# Patient Record
Sex: Female | Born: 1975 | Race: Black or African American | Hispanic: No | Marital: Married | Smoking: Never smoker
Health system: Southern US, Community
[De-identification: ages and names within clinical notes are randomized; demographics above are authoritative.]

## PROBLEM LIST (undated history)

## (undated) DIAGNOSIS — K219 Gastro-esophageal reflux disease without esophagitis: Secondary | ICD-10-CM

## (undated) DIAGNOSIS — O165 Unspecified maternal hypertension, complicating the puerperium: Secondary | ICD-10-CM

---

## 2003-09-20 ENCOUNTER — Emergency Department (HOSPITAL_COMMUNITY): Admission: EM | Admit: 2003-09-20 | Discharge: 2003-09-20 | Payer: Self-pay | Admitting: Family Medicine

## 2004-06-04 ENCOUNTER — Emergency Department (HOSPITAL_COMMUNITY): Admission: EM | Admit: 2004-06-04 | Discharge: 2004-06-04 | Payer: Self-pay | Admitting: Family Medicine

## 2004-07-19 ENCOUNTER — Emergency Department (HOSPITAL_COMMUNITY): Admission: EM | Admit: 2004-07-19 | Discharge: 2004-07-19 | Payer: Self-pay | Admitting: Family Medicine

## 2004-10-16 ENCOUNTER — Ambulatory Visit (HOSPITAL_COMMUNITY): Admission: RE | Admit: 2004-10-16 | Discharge: 2004-10-16 | Payer: Self-pay | Admitting: Family Medicine

## 2004-10-24 ENCOUNTER — Ambulatory Visit: Payer: Self-pay | Admitting: Internal Medicine

## 2004-10-29 ENCOUNTER — Ambulatory Visit: Payer: Self-pay | Admitting: Internal Medicine

## 2004-12-04 ENCOUNTER — Ambulatory Visit: Payer: Self-pay | Admitting: Internal Medicine

## 2005-12-24 ENCOUNTER — Ambulatory Visit: Payer: Self-pay | Admitting: Internal Medicine

## 2006-07-14 ENCOUNTER — Inpatient Hospital Stay (HOSPITAL_COMMUNITY): Admission: AD | Admit: 2006-07-14 | Discharge: 2006-07-14 | Payer: Self-pay | Admitting: Obstetrics & Gynecology

## 2006-07-16 ENCOUNTER — Inpatient Hospital Stay (HOSPITAL_COMMUNITY): Admission: AD | Admit: 2006-07-16 | Discharge: 2006-07-16 | Payer: Self-pay | Admitting: Obstetrics & Gynecology

## 2006-07-18 ENCOUNTER — Inpatient Hospital Stay (HOSPITAL_COMMUNITY): Admission: AD | Admit: 2006-07-18 | Discharge: 2006-07-23 | Payer: Self-pay | Admitting: Obstetrics & Gynecology

## 2006-07-20 ENCOUNTER — Encounter (INDEPENDENT_AMBULATORY_CARE_PROVIDER_SITE_OTHER): Payer: Self-pay | Admitting: Specialist

## 2006-07-20 ENCOUNTER — Ambulatory Visit (HOSPITAL_COMMUNITY): Admission: RE | Admit: 2006-07-20 | Discharge: 2006-07-20 | Payer: Self-pay | Admitting: Obstetrics & Gynecology

## 2006-07-24 ENCOUNTER — Encounter: Admission: RE | Admit: 2006-07-24 | Discharge: 2006-07-29 | Payer: Self-pay | Admitting: Obstetrics and Gynecology

## 2006-09-17 ENCOUNTER — Ambulatory Visit: Payer: Self-pay | Admitting: Internal Medicine

## 2006-09-17 LAB — CONVERTED CEMR LAB
Amylase: 88 units/L (ref 27–131)
Basophils Absolute: 0 10*3/uL (ref 0.0–0.1)
CO2: 31 meq/L (ref 19–32)
Chloride: 105 meq/L (ref 96–112)
Creatinine, Ser: 0.7 mg/dL (ref 0.4–1.2)
Eosinophils Absolute: 0.1 10*3/uL (ref 0.0–0.6)
Hemoglobin: 13.2 g/dL (ref 12.0–15.0)
Ketones, ur: NEGATIVE mg/dL
Leukocytes, UA: NEGATIVE
Lipase: 21 units/L (ref 11.0–59.0)
Lymphocytes Relative: 35.2 % (ref 12.0–46.0)
MCHC: 33.1 g/dL (ref 30.0–36.0)
Monocytes Relative: 4.8 % (ref 3.0–11.0)
Potassium: 3.9 meq/L (ref 3.5–5.1)
Sodium: 140 meq/L (ref 135–145)

## 2006-09-23 ENCOUNTER — Ambulatory Visit (HOSPITAL_COMMUNITY): Admission: RE | Admit: 2006-09-23 | Discharge: 2006-09-23 | Payer: Self-pay | Admitting: Internal Medicine

## 2006-09-23 ENCOUNTER — Ambulatory Visit: Payer: Self-pay | Admitting: Internal Medicine

## 2006-10-27 ENCOUNTER — Ambulatory Visit: Payer: Self-pay | Admitting: Internal Medicine

## 2007-07-13 DIAGNOSIS — K449 Diaphragmatic hernia without obstruction or gangrene: Secondary | ICD-10-CM | POA: Insufficient documentation

## 2007-07-13 DIAGNOSIS — G43909 Migraine, unspecified, not intractable, without status migrainosus: Secondary | ICD-10-CM | POA: Insufficient documentation

## 2007-07-13 DIAGNOSIS — K219 Gastro-esophageal reflux disease without esophagitis: Secondary | ICD-10-CM

## 2007-09-02 ENCOUNTER — Telehealth: Payer: Self-pay | Admitting: Internal Medicine

## 2008-11-13 ENCOUNTER — Inpatient Hospital Stay (HOSPITAL_COMMUNITY): Admission: AD | Admit: 2008-11-13 | Discharge: 2008-11-13 | Payer: Self-pay | Admitting: Obstetrics and Gynecology

## 2008-11-14 ENCOUNTER — Ambulatory Visit (HOSPITAL_COMMUNITY): Admission: AD | Admit: 2008-11-14 | Discharge: 2008-11-14 | Payer: Self-pay | Admitting: Obstetrics and Gynecology

## 2009-02-01 ENCOUNTER — Inpatient Hospital Stay (HOSPITAL_COMMUNITY): Admission: RE | Admit: 2009-02-01 | Discharge: 2009-02-03 | Payer: Self-pay | Admitting: Obstetrics and Gynecology

## 2010-07-03 LAB — CBC
HCT: 32.5 % — ABNORMAL LOW (ref 36.0–46.0)
HCT: 38.7 % (ref 36.0–46.0)
Hemoglobin: 11 g/dL — ABNORMAL LOW (ref 12.0–15.0)
MCV: 92.9 fL (ref 78.0–100.0)
WBC: 9.3 10*3/uL (ref 4.0–10.5)

## 2010-08-13 LAB — ABO/RH: RH Type: POSITIVE

## 2010-08-13 LAB — HIV ANTIBODY (ROUTINE TESTING W REFLEX): HIV: NONREACTIVE

## 2010-08-13 LAB — ANTIBODY SCREEN: Antibody Screen: NEGATIVE

## 2010-08-13 LAB — HEPATITIS B SURFACE ANTIGEN: Hepatitis B Surface Ag: NEGATIVE

## 2010-08-13 NOTE — Assessment & Plan Note (Signed)
[facility]                         GASTROENTEROLOGY OFFICE NOTE   NAME:[name], [name] PEIL               MRN:          [id number]  DATE:09/17/2006                            DOB:          05-03-1975    REFERRING PHYSICIAN:  Maxie Better, M.D.   OFFICE CONSULTATION NOTE   REASON FOR CONSULTATION:  Persistent worsening abdominal pain.   HISTORY:  This is a pleasant 35 year old female with a history of  gastroesophageal reflux disease.  She is referred today regarding  persistent abdominal pain.  She has been seen in this office for  recurrent problems with epigastric pain.  Previous evaluations have  included upper endoscopy, which revealed changes consistent with reflux  and abdominal ultrasound, which was negative except for gallbladder  polyp.  She was last seen in this office in September of 2007.  Her  problems with abdominal discomfort were resolved on Prevacid.  She  continues on Prevacid.  She did well until this past April.  She was  about [redacted] weeks pregnant and underwent urgent Cesarean section July 20, 2006 for fetal distress.  At that time, some abnormally cloudy fluid  with an abnormal odor was apparrently noted.  She had an elevated white  blood cell count of 23,000.  A CT scan of the abdomen and pelvis was  ordered and performed on July 20, 2006.  This was quite abnormal with  evidence of right-sided pneumonia, small to moderate fluid collection in  the right lower quadrant and pelvis, as well as mild to moderate lateral  hydronephrosis.  She was seen by Dr. Abbey Chatters in consultation.  She  was treated with antibiotics and apparently improved.  It is not clear  that she had followup CT scan.  In any event, she says that she went  home and was well all of May, as well as June, until this past Saturday  when she developed epigastric discomfort.  There has been some nausea,  though no vomiting or fevers.  No change in bowel  habits.  The  discomfort is not exacerbated by meals.  However, the discomfort is  currently exacerbated by movement or jarring motions.  She has continued  on Prevacid without improvement.  Gas-X and Tums have been unhelpful.  She saw Dr. Cherly Hensen yesterday, who recommended GI evaluation.  The  patient contacted the office this morning and was worked into the  schedule on an urgent basis.   PHYSICAL EXAM:  Finds a well-appearing female in no acute distress.  Blood pressure 126/82, heart rate 90, weight is 125.6 pounds.  HEENT:  Sclerae anicteric, conjunctivae pink.  Oral mucosa intact.  No  adenopathy.  LUNGS:  Clear to auscultation and percussion.  HEART:  Regular.  ABDOMEN:  Soft with slight fullness in the epigastric region.  She is  tender diffusely, though mostly in the epigastric region.  No rebound or  guarding.  EXTREMITIES:  Without edema.   IMPRESSION:  The patient presents today with persistent epigastric  discomfort.  This is 7 weeks after Cesarean section and an abnormal CT  scan as described above.  My current concern is that this may  represent  the same ill defined process as previous.The possibility of smoldering  retrocecal appendicitis remains.  She is not acutely ill, though I did  offer her hospitalization to help sort this out.  She declined.  As  such, I would like to order stat laboratories including CBC,  comprehensive metabolic panel, amylase, lipase, and urinalysis.  Additionally, we will set her up for a stat contrast-enhanced CT scan of  the abdomen and pelvis.  Those results will be called to myself or the  on-call physician, Dr. Leone Payor, with whom I have discussed the case.  The patient will wait for further directions.  If no obvious problem or  significant abnormality found, then we will increase her proton pump  inhibitor twice daily and ask her to follow up with myself in the office  next week.   ADDENDUM:  Labs were unremarkable. CT of abdomen was  negative. CT of the  pelvis showed three discrete  organized fluid collections measuring  between 2cm and 4cm. I discussed this with Dr. Cherly Hensen, who will manage  the pelvic fluid collections as she sees fit. I will see the patient  back next week for her seemingly unrelated epigastric pain.     Wilhemina Bonito. Marina Goodell, MD  Electronically Signed    JNP/MedQ  DD: 09/17/2006  DT: 09/17/2006  Job #: [id number]   cc:   Maxie Better, M.D.  Adolph Pollack, M.D.

## 2010-08-13 NOTE — Discharge Summary (Signed)
NAME:  [name] [name], [name] [name] NO.:  [id number]   MEDICAL RECORD NO.:  [id number]          PATIENT TYPE:  INP   LOCATION:  9312                          FACILITY:  [facility]   PHYSICIAN:  Genia Del, M.D.DATE OF BIRTH:  1976-02-02   DATE OF ADMISSION:  07/17/2006  DATE OF DISCHARGE:  07/23/2006                               DISCHARGE SUMMARY   ADMISSION DIAGNOSIS:  [redacted] weeks gestation in preterm labor, vaso previa  and fetal heart rate monitoring with decelerations.   DISCHARGE DIAGNOSIS:  [redacted] weeks gestation in preterm labor, vaso previa  and fetal heart rate monitoring with decelerations plus submucosal myoma  and peritonitis.   PROCEDURE:  Urgent primary low transverse C-section, resection of myoma,  peritoneal fluid appeared infected, no evidence of appendicitis, good  irrigation and suction of the abdominopelvic cavity. No abscess  identified. The estimated blood loss was 700 mL. The patient received  Ancef 1 gram IV at cord clamping. The patient was put on Zosyn IV postop  to control any infectious process.  She was found to have a right-sided  pneumonia and an inflammatory process intra-abdominally there was no  evidence of peritonitis clinically after the surgery. The patient had  good evaluation of her pneumonia and the infectious process cleared  under the Zosyn IV.  She was discharged home on postop day #3 in good  stable status. She was continued on p.o. antibiotics with azithromycin  x6 more days.  She was given postoperative advice and will follow-up at  [facility] office. Her postoperative hemoglobin was 10 and no other  complication.      Genia Del, M.D.  Electronically Signed     ML/MEDQ  D:  09/22/2006  T:  09/22/2006  Job:  [id number]

## 2010-08-13 NOTE — Assessment & Plan Note (Signed)
[facility]                         GASTROENTEROLOGY OFFICE NOTE   NAME:[name] [name], [name] [name]               MRN:          [id number]  DATE:09/23/2006                            DOB:          1976-03-03    HISTORY:  [name] [name] presents today for followup.  She was evaluated  September 17, 2006 for worsening abdominal pain.  See that dictation for  details.  Laboratories, including CBC, comprehensive metabolic panel,  amylase, lipase, and urinalysis were unremarkable.  She went for an  urgent CT scan of the abdomen and pelvis.  The abdomen was unremarkable.  The pelvis revealed 3 discrete, well-defined fluid collections measuring  between 2-1/2 and 4-1/2 cm.  She is status post urgent caesarean section  in late April.  The patient's Prevacid was increased to 30 mg b.i.d. and  she was given Darvocet as needed for pain.  The findings in the pelvis  were discussed with Dr. Cherly Hensen.  I asked the patient to return today  for followup.  Since her last visit she continues with abdominal  discomfort.  She continues with some epigastric discomfort.  However,  she also now has lower abdominal and pelvic discomfort.  She finds it  difficult to stand for long periods of time.  She denies nausea,  vomiting, or fevers.  Her appetite is good.  She is worried.  I  scheduled a HIDA scan which was performed this morning.  This was  normal.   PHYSICAL EXAMINATION:  A well-appearing female in no acute distress.  Blood pressure is 118/80, heart rate is 100 and regular, weight is 124.4  pounds.  HEENT:  Sclerae anicteric.  LUNGS:  Clear.  HEART:  Regular.  ABDOMEN:  Soft with complaints of tenderness to palpation throughout  though mostly in the right lower quadrant.  No rebound or guarding.   IMPRESSION:  Abdominal discomfort.  No obvious acute abdominal process.  Laboratories, CT scan of the abdomen, and most recent endoscopy  unremarkable.  She is on b.i.d. Prevacid.  I  am wondering how much of  her discomfort might be related to the fluid collections in the pelvis  as well as expected changes post caesarean section.  From my standpoint,  I would like to continue her on b.i.d. Prevacid and Darvocet as needed  for pain.  With regards to the pelvic fluid collections, she will follow  up with Dr. Cherly Hensen.  The patient tells me that she has an ultrasound  scheduled with Dr. Cherly Hensen for later this week.  I tried to provide  reassurance with regards to the abdomen proper.  I would like to see her  back for followup in 1 month.     Wilhemina Bonito. Marina Goodell, MD  Electronically Signed    JNP/MedQ  DD: 09/23/2006  DT: 09/23/2006  Job #: [id number]   cc:   Maxie Better, M.D.  Adolph Pollack, M.D.

## 2010-08-13 NOTE — Assessment & Plan Note (Signed)
[facility]                         GASTROENTEROLOGY OFFICE NOTE   NAME:[name], [name] HORN               MRN:          [id number]  DATE:10/27/2006                            DOB:          November 15, 1975    HISTORY:  [name] [name] presents today for followup.  She was evaluated  September 23, 2006 for abdominal discomfort, as well as abnormal pelvic fluid  collections on recent CT scan.  In terms of her upper abdominal  discomfort, no cause elucidated.  Her symptoms seem to be exacerbated by  dairy products.  Lactaid helps occasionally.  She was given Darvocet,  which she required approximately 2 pills per week.  This helps.  In  terms of her lower abdominal discomfort and fullness, this has improved.  I received a call from Dr. Cherly Hensen, who performed a pelvic ultrasound  about a week after the patient's office visit.  She told me that the  pelvic ultrasound was now negative.  The patient states she is overall  feeling much better without any new or active problems.   CURRENT MEDICATIONS:  Darvocet as needed.  Prevacid.  Lactaid.   PHYSICAL EXAM:  Finds a well-appearing female in no acute distress.  Blood pressure 118/76, heart rate is 88, weight is 124.2 pounds (no  change).  ABDOMEN:  Soft without tenderness, mass, or hernia.   IMPRESSION:  1. Chronic intermittent epigastric discomfort of uncertain cause.      Question functional dyspepsia.  2. Problems with lower abdominal fullness, discomfort, and pelvic      fluid collections.  Seemingly resolved.   RECOMMENDATIONS:  1. Continue occasional Darvocet as needed for discomfort.      Prescription of 60 with 1 refill has been provided.  2. Routine GI followup in about 6 months.  Sooner if needed.     Wilhemina Bonito. Marina Goodell, MD  Electronically Signed    JNP/MedQ  DD: 10/27/2006  DT: 10/28/2006  Job #: [id number]   cc:   Maxie Better, M.D.  Adolph Pollack, M.D.

## 2010-08-13 NOTE — Assessment & Plan Note (Signed)
[facility]                         GASTROENTEROLOGY OFFICE NOTE   NAME:Makris, SOMALY MARTENEY               MRN:          [id number]  DATE:09/17/2006                            DOB:          1976-02-11    Ms. [name] [name] is a patient of Dr. Lamar Sprinkles seen today for epigastric  pain.  She had had a complicated post delivery course with fluid  collections in the abdomen and other problems, and a CT scan was ordered  today because of her pain.  She has 3 small, well-defined fluid  collections in the pelvis that could be an abscess but she has no fever  and pain there.  These fluid collections are 2-4 cm.  Her CBC, her BMET,  her amylase and lipase are all normal.  She has a small amount of blood  in her urine.  I told her that she could go home as she has no fever or  toxic signs or symptoms that we know of at this point and take her  Prevacid twice a day as Dr. Marina Goodell has suggested, increasing to b.i.d.  from daily.  I will communicate the results with Dr. Marina Goodell and we will  coordinate any other further followup.  He plans to see her back in a  week or so at least, but further plans pending that.     Iva Boop, MD,FACG  Electronically Signed    CEG/MedQ  DD: 09/17/2006  DT: 09/17/2006  Job #: [id number]   cc:   Wilhemina Bonito. Marina Goodell, MD  Maxie Better, M.D.

## 2010-08-16 NOTE — Assessment & Plan Note (Signed)
 [facility]ALTH[facility]                           GASTROENTEROLOGY OFFICE NOTE   [name] [name], [name] [name]                     MRN:          [id number]  DATE:12/24/2005                            DOB:          04-21-1975    HISTORY:  [name] [name] presents today for followup. She is a 35 year old with  intermittent epigastric discomfort, felt secondary to reflux disease. For  this she is on daily proton pump inhibitor therapy. Currently, Prevacid 30  mg daily. On the medication, she reports excellent control of symptoms with  rare minor discomfort. She has been apprehensive about going off the  medicine for fear of recurrent symptoms. She denies any appreciable  medication side effects. She does request medication refill. She has had no  interval medical problems or other relevant issues.   CURRENT MEDICATIONS:  Prevacid 30 mg daily.   PHYSICAL EXAMINATION:  Finds a well-appearing female in no acute distress.  She is alert and oriented.  Blood pressure 118/68, heart rate 62 and  regular, weight 129.  HEENT: Sclera anicteric.  LUNGS: Clear.  HEART: Regular.  ABDOMEN: Soft, without tenderness, mass or hernia. Good bowel sounds heard.   IMPRESSION:  Intermittent epigastric discomfort likely related to reflux  disease. Symptoms absent on once daily Prevacid.   RECOMMENDATIONS:  1. Continue Prevacid 30 mg daily. A prescription of multiple refills has      been provided.  2. Patient should feel free to hold her medication for an extended period      of time to see if symptoms return. If so, then resume medication, if      not, then she might use the medication on-demand.  3. Ongoing medical care with Dr. Katrinka Blazing.            ______________________________  Wilhemina Bonito. Eda Keys., MD      JNP/MedQ  DD:  12/24/2005  DT:  12/26/2005  Job #:  [id number]   cc:   Dario Guardian, M.D.

## 2010-08-16 NOTE — H&P (Signed)
NAME:  [name] [name], [name] [name] NO.:  [id number]   MEDICAL RECORD NO.:  [id number]          PATIENT TYPE:  OIB   LOCATION:  9160                          FACILITY:  [facility]   PHYSICIAN:  Genia Del, M.D.DATE OF BIRTH:  1975-12-26   DATE OF ADMISSION:  07/17/2006  DATE OF DISCHARGE:                              HISTORY & PHYSICAL   CHIEF COMPLAINT:  Contractions.  She is a 35 year old African-American  female, G1, P0 at 37-4/[redacted] weeks gestation who presents with increased  frequency of contractions.  Seen in [facility] Admissions last night with  subcu terbutaline x1 given, normal ultrasound with a cervical length of  3.2 cm.  FFN was negative on April 15, urine culture negative on April  15, wet prep negative today, GBS pending.  She has a pregnancy that is  complicated by a complete placenta previa as noted on repeat ultrasound  of July 16, 2006.  AGA fetus is noted.  SHE HAS A CODEINE ALLERGY.   MEDICATIONS:  Prenatal vitamins.   FAMILY HISTORY:  1. Chronic hypertension.  2. Myocardial infarction.   She has a history of menstrual migraines and gastroesophageal reflux  disease.  She is a nonsmoker, nondrinker.  She denies domestic or  physical violence.  Pregnancy course otherwise uncomplicated as noted.   PHYSICAL EXAM:  She is an uncomfortable, otherwise normal appearing  African-American female in no acute distress.  Blood pressure 118/84.  HEENT:  Normal.  LUNGS:  Clear.  HEART:  Regular rate and rhythm.  ABDOMEN:  Soft, gravid, nontender, no CVA tenderness.  SKIN:  Intact.  NEUROLOGIC EXAM:  Nonfocal.  CERVICAL SPECULUM EXAM:  Reveals the cervix to visually be long and  closed, no bleeding is noted.  EXTREMITIES:  Show no cords.  NEUROLOGIC EXAM:  Nonfocal.   NST is reactive.  Contractions are rare to nonexistent after subcu  terbutaline and Procardia given.   IMPRESSION:  1. A 31-4/7 week intrauterine pregnancy.  2. Complete placenta previa.  3.  Preterm contractions with no evidence of cervical change.  Fetal      fibronectin negative and no bleeding.   PLAN:  1. Will admit for 22 hour observation.  2. Given placenta previa and continued contractions, start Procardia      10 mEq p.o. q.4h., given betamethasone 12.5 mg IM and repeat in 24      hours.  3. Continue IV fluids.      Lenoard Aden, M.D.  Electronically Signed     ______________________________  Genia Del, M.D.    RJT/MEDQ  D:  07/17/2006  T:  07/17/2006  Job:  [id number]

## 2010-08-16 NOTE — Op Note (Signed)
NAME:  [name] [name], [name] [name] NO.:  [id number]   MEDICAL RECORD NO.:  [id number]          PATIENT TYPE:  INP   LOCATION:                                [facility]   PHYSICIAN:  Genia Del, M.D.DATE OF BIRTH:  [birth year]   DATE OF PROCEDURE:  07/20/2006  DATE OF DISCHARGE:                               OPERATIVE REPORT   PREOPERATIVE DIAGNOSIS:  32 weeks with preterm labor, vaso previa and  fetal heart rate decelerations.   POSTOP DIAGNOSIS:  32 weeks with preterm labor, vaso previa and fetal  heart rate decelerations.  Plus peritonitis and submucosal myoma.   PROCEDURE:  Urgent primary low transverse C-section and resection of  myoma.   SURGEON:  Dr. Genia Del   ASSISTANT:  Dr. Marina Gravel.   ANESTHESIOLOGIST:  Dr. Malen Gauze.   PROCEDURE:  Under spinal anesthesia with the patient in 15 degrees left  decubitus position.  She is prepped with Betadine on the abdominal,  suprapubic, vulvar, and vaginal areas.  The bladder catheter is inserted  with a Foley and the patient is draped as usual.  The level of  anesthesia is verified and adequate.  We infiltrate the subcutaneous  tissue with Marcaine one-quarter plain 10 mL.  We make a Pfannenstiel  incision with a scalpel.  We opened the aponeurosis transversely with  Mayo scissors.  We separated the aponeurosis from the recti muscles on  the midline superiorly and inferiorly.  We opened the parietal  peritoneum with a Metz scissors longitudinally.  The peritoneal fluid is  yellowish with a foul odor. We then opened the visceral peritoneum  transversely over the lower uterine segment with the [facility] scissors and  reclined the bladder downward.  The bladder retractor is put in place.  We make a low transverse uterine incision with the scalpel and extended  with the dressing scissors.  The amniotic fluid is clear.  The fetus is  in cephalic presentation.  Birth of a baby boy at 12:56. The baby is  suctioned, the cord is clamped and cut. The baby is given to the  neonatal team.  Apgars are 7 and 9. A pH is done which comes back at  7.37. The cord blood is taken.  The placenta was very low posterior with  the velamentous cord insertion and a vaso previa. We evacuate the  placenta and sent to pathology. Uterine revision done. We note a small 1  cm submucosal myoma on the superior posterior wall.  It is removed with  the fingers and sent to pathology.  The uterus contracts well with  Pitocin IV. The hysterotomy is closed with a first locked running suture  of chromic 0. A second layer in a mattress fashion is done.  Hemostasis  is adequate.  Both ovaries are normal in size and appearance.  Both  tubes are normal in appearance. We then irrigate and suction the  abdominopelvic cavity.  We attempt to visualize the appendix but it is  not successful.  We then complete hemostasis on the recti muscles where  necessary with the electrocautery.  We closed the aponeurosis in two  half  running sutures of Vicryl 0.  We then verify hemostasis on the  adipose tissue and complete it with the electrocautery.  We  reapproximate the skin with staples.  A dry dressing is applied.  The  count of instruments and gauze was complete x2. The estimated blood loss  was 700 mL.  No complications occurred and  the patient was brought to recovery room in good status. Note that a  dose of Ancef was given at the beginning of the intervention.  The  patient had been on Unasyn IV.  We will treat her with Zosyn in the  postop time and she will go to ICU given the diagnosis of peritonitis.      Genia Del, M.D.  Electronically Signed     ML/MEDQ  D:  07/20/2006  T:  07/20/2006  Job:  [id number]

## 2010-08-16 NOTE — Consult Note (Signed)
NAME:  [name] [name], [name] [name] NO.:  [id number]   MEDICAL RECORD NO.:  [id number]          PATIENT TYPE:  INP   LOCATION:                                [facility]   PHYSICIAN:  Adolph Pollack, M.D.DATE OF BIRTH:  Oct 03, 1975   DATE OF CONSULTATION:  07/21/2006  DATE OF DISCHARGE:                                 CONSULTATION   REASON FOR CONSULTATION:  Peritonitis.   HISTORY OF PRESENT ILLNESS:  This is a 35 year old female who for the  past week has been having problems with contractions.  She was about 31-  [redacted] weeks pregnant. She was admitted on July 13, 2006 and treated  medically to try to get the contractions to stop.  Apparently, there was  some fetal distress and she underwent an urgent cesarean section on  July 20, 2006.  During the procedure, some cloudy fluid with an odd  odor was noted. The appendix was not seen.  She had irrigation of the  abdominopelvic cavity performed and the wound was closed. She was  brought to the intensive care unit.  A CT scan was ordered looking for  appendicitis. The appendix was not visualized.  She does have  significant  infiltrates in the right middle and lower lobes in the  right lung. She has a small to moderate amount of fluid collection in  the pelvic and right lower quadrant area with possible early thin  rimming. White count was 23,000 on the 20th and on the 21st it was down  to 16,000.  Subsequently, I was asked to see her to evaluate for  possible peritonitis.  Currently she says she is pain free and feels  good.  Pre-delivery she did not have any fever or chills.   PAST MEDICAL HISTORY:  1. Migraine headaches.  2. Gastroesophageal reflux disease.   PREADMISSION MEDICATIONS:  Prenatal vitamins and iron. She has been  started on Zosyn, Protonix, and some p.r.n. medications.   SOCIAL HISTORY:  Nonsmoker, nondrinker.   REVIEW OF SYSTEMS:  CARDIAC:  No cardiac disease.  PULMONARY: She states  she has  developed a cough since being in the hospital.   PHYSICAL EXAMINATION:  GENERAL:  Well-developed, well-nourished female.  She is in no acute distress, pleasant, cooperative, sitting up in the  bed smiling.  VITAL SIGNS:  Temperature 99.8, blood pressure 133/83, pulse 107.  LUNGS:  Rhonchi-type changes in the right lung. Left lung is clear.  ABDOMEN:  Slightly firm with a lower transverse dressing. There is  minimal tenderness present.  No peritoneal signs noted.  Hypoactive  bowel sounds noted.   CT was reviewed with Dr. Lenoria Farrier A. Hoss.   IMPRESSION:  1. Right-sided pneumonia.  2. Abdominal fluid collection of unclear significance immediately      postoperatively.  Also cannot definitely explain the intraoperative      findings of some cloudy fluid with an odd odor.  There is a      possibility that she could have had a predelivery intra-abdominal      inflammatory infectious process leading to the premature      contractions. She looks  fairly good right now and had      abdominopelvic irrigation and IV antibiotics.  No obvious      appendicitis on her CT scan.   RECOMMENDATIONS:  I will continue her IV antibiotics and switch to oral  for the pneumonia. Would follow her white blood cell count and the  clinical examination. I would progress her along with a routine post C  section postoperative course as she would tolerate. Currently, I do not  feel any further procedures or operative intervention is needed.  If she  would begin to have a persistent leukocytosis or get more ill, I would  recommend repeating her CT scan. I will be available if needed further.      Adolph Pollack, M.D.  Electronically Signed     TJR/MEDQ  D:  07/21/2006  T:  07/21/2006  Job:  [id number]   cc:   Maxie Better, M.D.  Fax: [phone]

## 2011-01-23 ENCOUNTER — Other Ambulatory Visit: Payer: Self-pay | Admitting: Obstetrics and Gynecology

## 2011-01-30 ENCOUNTER — Encounter (HOSPITAL_COMMUNITY)
Admission: RE | Admit: 2011-01-30 | Discharge: 2011-01-30 | Disposition: A | Discharge status: Home / Self Care | Payer: Medicaid Other | Source: Ambulatory Visit | Attending: Obstetrics and Gynecology | Admitting: Obstetrics and Gynecology

## 2011-01-30 ENCOUNTER — Encounter (HOSPITAL_COMMUNITY): Payer: Self-pay

## 2011-01-30 HISTORY — DX: Gastro-esophageal reflux disease without esophagitis: K21.9

## 2011-01-30 LAB — SURGICAL PCR SCREEN: Staphylococcus aureus: POSITIVE — AB

## 2011-01-30 LAB — CBC
HCT: 38 % (ref 36.0–46.0)
Hemoglobin: 12 g/dL (ref 12.0–15.0)
MCHC: 31.6 g/dL (ref 30.0–36.0)
RBC: 4.3 MIL/uL (ref 3.87–5.11)

## 2011-01-30 NOTE — Patient Instructions (Addendum)
   Your procedure is scheduled WU:[id number] November 5th  Enter through the Main Entrance of [facility] at:6am Pick up the phone at the desk and dial [phone] and inform us of your arrival.  Please call this number if you have any problems the morning of surgery: [phone]  Remember: Do not eat food after midnight:Sunday Do not drink clear liquids after:midnight Sunday Take these medicines the morning of surgery with a SIP OF WATER:none  Do not wear jewelry, make-up, or FINGER nail polish Do not wear lotions, powders, or perfumes.  You may not  wear deodorant. Do not shave 48 hours prior to surgery. Do not bring valuables to the hospital.  Leave suitcase in the car. After Surgery it may be brought to your room. For patients being admitted to the hospital, checkout time is 11:00am the day of discharge.     Remember to use your hibiclens as instructed.Please shower with 1/2 bottle the evening before your surgery and the other 1/2 bottle the morning of surgery.

## 2011-02-02 MED ORDER — CEFAZOLIN SODIUM-DEXTROSE 2-3 GM-% IV SOLR
2.0000 g | INTRAVENOUS | Status: AC
  Administered 2011-02-03: 2 g via INTRAVENOUS
  Filled 2011-02-02: qty 50

## 2011-02-03 ENCOUNTER — Encounter (HOSPITAL_COMMUNITY): Payer: Self-pay | Admitting: *Deleted

## 2011-02-03 ENCOUNTER — Other Ambulatory Visit: Payer: Self-pay | Admitting: Obstetrics and Gynecology

## 2011-02-03 ENCOUNTER — Encounter (HOSPITAL_COMMUNITY)
Admission: RE | Disposition: A | Discharge status: Home / Self Care | Payer: Self-pay | Source: Ambulatory Visit | Attending: Obstetrics and Gynecology

## 2011-02-03 ENCOUNTER — Inpatient Hospital Stay (HOSPITAL_COMMUNITY): Payer: Medicaid Other | Admitting: Anesthesiology

## 2011-02-03 ENCOUNTER — Inpatient Hospital Stay (HOSPITAL_COMMUNITY)
Admission: RE | Admit: 2011-02-03 | Discharge: 2011-02-05 | DRG: 766 | Disposition: A | Discharge status: Home / Self Care | Payer: Medicaid Other | Source: Ambulatory Visit | Attending: Obstetrics and Gynecology | Admitting: Obstetrics and Gynecology

## 2011-02-03 ENCOUNTER — Encounter (HOSPITAL_COMMUNITY): Payer: Self-pay | Admitting: Anesthesiology

## 2011-02-03 DIAGNOSIS — Z302 Encounter for sterilization: Secondary | ICD-10-CM

## 2011-02-03 DIAGNOSIS — Z01818 Encounter for other preprocedural examination: Secondary | ICD-10-CM

## 2011-02-03 DIAGNOSIS — O34219 Maternal care for unspecified type scar from previous cesarean delivery: Principal | ICD-10-CM | POA: Diagnosis present

## 2011-02-03 DIAGNOSIS — Z01812 Encounter for preprocedural laboratory examination: Secondary | ICD-10-CM

## 2011-02-03 LAB — TYPE AND SCREEN: Antibody Screen: NEGATIVE

## 2011-02-03 SURGERY — Surgical Case
Anesthesia: Spinal

## 2011-02-03 MED ORDER — NALOXONE HCL 0.4 MG/ML IJ SOLN: 0.4000 mg | INTRAMUSCULAR | Status: DC | PRN

## 2011-02-03 MED ORDER — MORPHINE SULFATE 0.5 MG/ML IJ SOLN
INTRAMUSCULAR | Status: AC
  Filled 2011-02-03: qty 10

## 2011-02-03 MED ORDER — PRENATAL PLUS 27-1 MG PO TABS
1.0000 | ORAL_TABLET | Freq: Every day | ORAL | Status: DC
  Administered 2011-02-03 – 2011-02-05 (×3): 1 via ORAL
  Filled 2011-02-03 (×3): qty 1

## 2011-02-03 MED ORDER — DIPHENHYDRAMINE HCL 25 MG PO CAPS
25.0000 mg | ORAL_CAPSULE | Freq: Four times a day (QID) | ORAL | Status: DC | PRN
Start: 2011-02-03 — End: 2011-02-05

## 2011-02-03 MED ORDER — SCOPOLAMINE 1 MG/3DAYS TD PT72
1.0000 | MEDICATED_PATCH | Freq: Once | TRANSDERMAL | Status: DC
  Administered 2011-02-03: 1.5 mg via TRANSDERMAL

## 2011-02-03 MED ORDER — MORPHINE SULFATE (PF) 0.5 MG/ML IJ SOLN
INTRAMUSCULAR | Status: DC | PRN
  Administered 2011-02-03: .15 mg via INTRATHECAL

## 2011-02-03 MED ORDER — ZOLPIDEM TARTRATE 5 MG PO TABS: 5.0000 mg | ORAL_TABLET | Freq: Every evening | ORAL | Status: DC | PRN

## 2011-02-03 MED ORDER — CEFAZOLIN SODIUM 1-5 GM-% IV SOLN
1.0000 g | Freq: Three times a day (TID) | INTRAVENOUS | Status: AC
  Administered 2011-02-03 (×2): 1 g via INTRAVENOUS
  Filled 2011-02-03 (×2): qty 50

## 2011-02-03 MED ORDER — KETOROLAC TROMETHAMINE 30 MG/ML IJ SOLN
INTRAMUSCULAR | Status: AC
  Administered 2011-02-03: 30 mg via INTRAVENOUS
  Filled 2011-02-03: qty 1

## 2011-02-03 MED ORDER — SODIUM CHLORIDE 0.9 % IJ SOLN: 3.0000 mL | INTRAMUSCULAR | Status: DC | PRN

## 2011-02-03 MED ORDER — BUPIVACAINE HCL (PF) 0.25 % IJ SOLN
INTRAMUSCULAR | Status: DC | PRN
  Administered 2011-02-03: 10 mL

## 2011-02-03 MED ORDER — ONDANSETRON HCL 4 MG/2ML IJ SOLN
INTRAMUSCULAR | Status: AC
  Filled 2011-02-03: qty 2

## 2011-02-03 MED ORDER — MEPERIDINE HCL 25 MG/ML IJ SOLN
6.2500 mg | INTRAMUSCULAR | Status: DC | PRN
  Administered 2011-02-03: 6.25 mg via INTRAVENOUS

## 2011-02-03 MED ORDER — LANOLIN HYDROUS EX OINT: 1.0000 "application " | TOPICAL_OINTMENT | CUTANEOUS | Status: DC | PRN

## 2011-02-03 MED ORDER — SIMETHICONE 80 MG PO CHEW: 80.0000 mg | CHEWABLE_TABLET | ORAL | Status: DC | PRN

## 2011-02-03 MED ORDER — DIBUCAINE 1 % RE OINT: 1.0000 "application " | TOPICAL_OINTMENT | RECTAL | Status: DC | PRN

## 2011-02-03 MED ORDER — KETOROLAC TROMETHAMINE 30 MG/ML IJ SOLN
30.0000 mg | Freq: Four times a day (QID) | INTRAMUSCULAR | Status: AC | PRN
  Administered 2011-02-03: 30 mg via INTRAVENOUS

## 2011-02-03 MED ORDER — KETOROLAC TROMETHAMINE 30 MG/ML IJ SOLN: 30.0000 mg | Freq: Four times a day (QID) | INTRAMUSCULAR | Status: AC | PRN

## 2011-02-03 MED ORDER — FENTANYL CITRATE 0.05 MG/ML IJ SOLN
INTRAMUSCULAR | Status: DC | PRN
  Administered 2011-02-03: 25 ug via INTRATHECAL

## 2011-02-03 MED ORDER — FENTANYL CITRATE 0.05 MG/ML IJ SOLN: 25.0000 ug | INTRAMUSCULAR | Status: DC | PRN

## 2011-02-03 MED ORDER — PHENYLEPHRINE HCL 10 MG/ML IJ SOLN
INTRAMUSCULAR | Status: DC | PRN
  Administered 2011-02-03: 80 ug via INTRAVENOUS
  Administered 2011-02-03 (×2): 40 ug via INTRAVENOUS
  Administered 2011-02-03: 80 ug via INTRAVENOUS
  Administered 2011-02-03: 40 ug via INTRAVENOUS
  Administered 2011-02-03: 80 ug via INTRAVENOUS
  Administered 2011-02-03: 40 ug via INTRAVENOUS

## 2011-02-03 MED ORDER — FERROUS SULFATE 325 (65 FE) MG PO TABS
325.0000 mg | ORAL_TABLET | Freq: Two times a day (BID) | ORAL | Status: DC
  Administered 2011-02-03 – 2011-02-05 (×4): 325 mg via ORAL
  Filled 2011-02-03 (×4): qty 1

## 2011-02-03 MED ORDER — MENTHOL 3 MG MT LOZG: 1.0000 | LOZENGE | OROMUCOSAL | Status: DC | PRN

## 2011-02-03 MED ORDER — DIPHENHYDRAMINE HCL 50 MG/ML IJ SOLN: 25.0000 mg | INTRAMUSCULAR | Status: DC | PRN

## 2011-02-03 MED ORDER — FENTANYL CITRATE 0.05 MG/ML IJ SOLN
INTRAMUSCULAR | Status: AC
  Filled 2011-02-03: qty 2

## 2011-02-03 MED ORDER — METHYLERGONOVINE MALEATE 0.2 MG PO TABS: 0.2000 mg | ORAL_TABLET | ORAL | Status: DC | PRN

## 2011-02-03 MED ORDER — IBUPROFEN 600 MG PO TABS: 600.0000 mg | ORAL_TABLET | Freq: Four times a day (QID) | ORAL | Status: DC | PRN

## 2011-02-03 MED ORDER — DIPHENHYDRAMINE HCL 25 MG PO CAPS: 25.0000 mg | ORAL_CAPSULE | ORAL | Status: DC | PRN

## 2011-02-03 MED ORDER — WITCH HAZEL-GLYCERIN EX PADS: 1.0000 "application " | MEDICATED_PAD | CUTANEOUS | Status: DC | PRN

## 2011-02-03 MED ORDER — BUPIVACAINE IN DEXTROSE 0.75-8.25 % IT SOLN
INTRATHECAL | Status: DC | PRN
  Administered 2011-02-03: 13 mg via INTRATHECAL

## 2011-02-03 MED ORDER — OXYTOCIN 20 UNITS IN LACTATED RINGERS INFUSION - SIMPLE: 125.0000 mL/h | INTRAVENOUS | Status: AC

## 2011-02-03 MED ORDER — SIMETHICONE 80 MG PO CHEW
80.0000 mg | CHEWABLE_TABLET | Freq: Three times a day (TID) | ORAL | Status: DC
  Administered 2011-02-03 – 2011-02-05 (×7): 80 mg via ORAL

## 2011-02-03 MED ORDER — SENNOSIDES-DOCUSATE SODIUM 8.6-50 MG PO TABS
2.0000 | ORAL_TABLET | Freq: Every day | ORAL | Status: DC
  Administered 2011-02-03: 2 via ORAL

## 2011-02-03 MED ORDER — DIPHENHYDRAMINE HCL 50 MG/ML IJ SOLN: 12.5000 mg | INTRAMUSCULAR | Status: DC | PRN

## 2011-02-03 MED ORDER — ONDANSETRON HCL 4 MG/2ML IJ SOLN: 4.0000 mg | INTRAMUSCULAR | Status: DC | PRN

## 2011-02-03 MED ORDER — ONDANSETRON HCL 4 MG PO TABS: 4.0000 mg | ORAL_TABLET | ORAL | Status: DC | PRN

## 2011-02-03 MED ORDER — NALBUPHINE SYRINGE 5 MG/0.5 ML
5.0000 mg | INJECTION | INTRAMUSCULAR | Status: DC | PRN
  Filled 2011-02-03: qty 1

## 2011-02-03 MED ORDER — LACTATED RINGERS IV SOLN
INTRAVENOUS | Status: DC
  Administered 2011-02-03 (×4): via INTRAVENOUS

## 2011-02-03 MED ORDER — OXYTOCIN 10 UNIT/ML IJ SOLN
INTRAMUSCULAR | Status: AC
  Filled 2011-02-03: qty 4

## 2011-02-03 MED ORDER — OXYCODONE-ACETAMINOPHEN 5-325 MG PO TABS: 1.0000 | ORAL_TABLET | ORAL | Status: DC | PRN

## 2011-02-03 MED ORDER — BISACODYL 10 MG RE SUPP: 10.0000 mg | Freq: Every day | RECTAL | Status: DC | PRN

## 2011-02-03 MED ORDER — ONDANSETRON HCL 4 MG/2ML IJ SOLN
INTRAMUSCULAR | Status: DC | PRN
  Administered 2011-02-03: 4 mg via INTRAVENOUS

## 2011-02-03 MED ORDER — METOCLOPRAMIDE HCL 5 MG/ML IJ SOLN: 10.0000 mg | Freq: Once | INTRAMUSCULAR | Status: DC | PRN

## 2011-02-03 MED ORDER — FLEET ENEMA 7-19 GM/118ML RE ENEM: 1.0000 | ENEMA | RECTAL | Status: DC | PRN

## 2011-02-03 MED ORDER — ONDANSETRON HCL 4 MG/2ML IJ SOLN: 4.0000 mg | Freq: Three times a day (TID) | INTRAMUSCULAR | Status: DC | PRN

## 2011-02-03 MED ORDER — IBUPROFEN 600 MG PO TABS
600.0000 mg | ORAL_TABLET | Freq: Four times a day (QID) | ORAL | Status: DC
  Administered 2011-02-03 – 2011-02-05 (×8): 600 mg via ORAL
  Filled 2011-02-03 (×8): qty 1

## 2011-02-03 MED ORDER — SODIUM CHLORIDE 0.9 % IJ SOLN: 3.0000 mL | Freq: Two times a day (BID) | INTRAMUSCULAR | Status: DC

## 2011-02-03 MED ORDER — MEPERIDINE HCL 25 MG/ML IJ SOLN
INTRAMUSCULAR | Status: AC
  Administered 2011-02-03: 6.25 mg via INTRAVENOUS
  Filled 2011-02-03: qty 1

## 2011-02-03 MED ORDER — METHYLERGONOVINE MALEATE 0.2 MG/ML IJ SOLN: 0.2000 mg | INTRAMUSCULAR | Status: DC | PRN

## 2011-02-03 MED ORDER — TETANUS-DIPHTH-ACELL PERTUSSIS 5-2.5-18.5 LF-MCG/0.5 IM SUSP
0.5000 mL | Freq: Once | INTRAMUSCULAR | Status: AC
  Administered 2011-02-05: 0.5 mL via INTRAMUSCULAR
  Filled 2011-02-03: qty 0.5

## 2011-02-03 MED ORDER — SODIUM CHLORIDE 0.9 % IV SOLN: 250.0000 mL | INTRAVENOUS | Status: DC

## 2011-02-03 MED ORDER — SODIUM CHLORIDE 0.9 % IV SOLN
1.0000 ug/kg/h | INTRAVENOUS | Status: DC | PRN
  Filled 2011-02-03: qty 2.5

## 2011-02-03 MED ORDER — OXYTOCIN 20 UNITS IN LACTATED RINGERS INFUSION - SIMPLE
INTRAVENOUS | Status: DC | PRN
  Administered 2011-02-03 (×2): 20 [IU] via INTRAVENOUS

## 2011-02-03 SURGICAL SUPPLY — 42 items
APL SKNCLS STERI-STRIP NONHPOA (GAUZE/BANDAGES/DRESSINGS)
BENZOIN TINCTURE PRP APPL 2/3 (GAUZE/BANDAGES/DRESSINGS) IMPLANT
CLOTH BEACON ORANGE TIMEOUT ST (SAFETY) ×2 IMPLANT
CONTAINER PREFILL 10% NBF 15ML (MISCELLANEOUS) ×2 IMPLANT
DRESSING TELFA 8X3 (GAUZE/BANDAGES/DRESSINGS) ×2 IMPLANT
ELECT REM PT RETURN 9FT ADLT (ELECTROSURGICAL) ×2
ELECTRODE REM PT RTRN 9FT ADLT (ELECTROSURGICAL) ×1 IMPLANT
EXTRACTOR VACUUM KIWI (MISCELLANEOUS) IMPLANT
EXTRACTOR VACUUM M CUP 4 TUBE (SUCTIONS) IMPLANT
GAUZE SPONGE 4X4 12PLY STRL LF (GAUZE/BANDAGES/DRESSINGS) ×3 IMPLANT
GLOVE BIO SURGEON STRL SZ 6.5 (GLOVE) ×3 IMPLANT
GLOVE BIOGEL PI IND STRL 7.0 (GLOVE) ×2 IMPLANT
GLOVE BIOGEL PI INDICATOR 7.0 (GLOVE) ×2
GOWN PREVENTION PLUS LG XLONG (DISPOSABLE) ×6 IMPLANT
KIT ABG SYR 3ML LUER SLIP (SYRINGE) IMPLANT
NDL HYPO 25X1 1.5 SAFETY (NEEDLE) ×1 IMPLANT
NDL HYPO 25X5/8 SAFETYGLIDE (NEEDLE) IMPLANT
NEEDLE HYPO 25X1 1.5 SAFETY (NEEDLE) ×2 IMPLANT
NEEDLE HYPO 25X5/8 SAFETYGLIDE (NEEDLE) IMPLANT
NS IRRIG 1000ML POUR BTL (IV SOLUTION) ×2 IMPLANT
PACK C SECTION WH (CUSTOM PROCEDURE TRAY) ×2 IMPLANT
PAD ABD 7.5X8 STRL (GAUZE/BANDAGES/DRESSINGS) ×2 IMPLANT
RTRCTR C-SECT PINK 25CM LRG (MISCELLANEOUS) IMPLANT
SLEEVE SCD COMPRESS KNEE MED (MISCELLANEOUS) IMPLANT
STAPLER VISISTAT 35W (STAPLE) ×1 IMPLANT
STRIP CLOSURE SKIN 1/2X4 (GAUZE/BANDAGES/DRESSINGS) IMPLANT
SUT CHROMIC GUT AB #0 18 (SUTURE) ×1 IMPLANT
SUT MNCRL 0 VIOLET CTX 36 (SUTURE) ×3 IMPLANT
SUT MON AB 4-0 PS1 27 (SUTURE) IMPLANT
SUT MONOCRYL 0 CTX 36 (SUTURE) ×3
SUT PLAIN 2 0 (SUTURE)
SUT PLAIN 2 0 XLH (SUTURE) ×1 IMPLANT
SUT PLAIN ABS 2-0 CT1 27XMFL (SUTURE) IMPLANT
SUT VIC AB 0 CT1 27 (SUTURE) ×6
SUT VIC AB 0 CT1 27XBRD ANBCTR (SUTURE) ×2 IMPLANT
SUT VIC AB 2-0 CT1 27 (SUTURE) ×2
SUT VIC AB 2-0 CT1 TAPERPNT 27 (SUTURE) ×1 IMPLANT
SUT VICRYL 0 TIES 12 18 (SUTURE) IMPLANT
SYR CONTROL 10ML LL (SYRINGE) ×2 IMPLANT
TOWEL OR 17X24 6PK STRL BLUE (TOWEL DISPOSABLE) ×4 IMPLANT
TRAY FOLEY CATH 14FR (SET/KITS/TRAYS/PACK) ×1 IMPLANT
WATER STERILE IRR 1000ML POUR (IV SOLUTION) ×1 IMPLANT

## 2011-02-03 NOTE — Consult Note (Signed)
Called to attend a repeat elective C/S at term gestation. At delivery infant in vertex, manually extracted with no cord complications noted. Spontaneous lusty cries with full exposure. Given tactile stim and bulb suction. No dysmorphic features. Apgar 9/9 @ 1/5 minutes respectively.   Care to assigned pediatrician.     Dagoberto Ligas MD [facility]

## 2011-02-03 NOTE — Op Note (Signed)
NAME:  [name] [name], [name] [name] NO.:  [id number]  MEDICAL RECORD NO.:  [id number]  LOCATION:  9104                          FACILITY:  [facility]  PHYSICIAN:  Maxie Better, M.D.DATE OF BIRTH:  May 05, 1975  DATE OF PROCEDURE:  02/03/2011 DATE OF DISCHARGE:                              OPERATIVE REPORT   PREOPERATIVE DIAGNOSES:  Previous cesarean section x2, term gestation, desires sterilization.  PROCEDURES:  Repeat cesarean section, Sharl Ma hysterotomy, modified Pomeroy tubal ligation.  POSTOPERATIVE DIAGNOSES:  Previous cesarean section x2, term gestation, desires sterilization.  ANESTHESIA:  Spinal.  SURGEON:  Maxie Better, MD  ASSISTANT:  Genia Del, MD  PROCEDURE:  Under adequate spinal anesthesia, the patient was placed in a supine position with a left lateral tilt.  She was sterilely prepped and draped in usual fashion.  Indwelling Foley catheter was sterilely placed.  0.25% Marcaine was injected along the previous Pfannenstiel skin incision.  Pfannenstiel skin incision was then made, carried down to the rectus fascia.  Rectus fascia was then opened transversely. Rectus fascia was then sharply and bluntly dissected off the rectus muscle in a superior and inferior fashion.  The rectus muscle was already partially separated.  The parietal peritoneum was opened sharply and extended.  The lower uterine segment was very thin.  The bladder was adherent to the lower uterine segment.  Careful transverse incision for the vesicouterine peritoneum was done.  The bladder minimally had descent.  An incision at the junction of the upper part of the thinness and the lower segment was done and extended with bandage scissors. Artificial rupture of membranes occurred.  Clear amniotic fluid noted. Subsequent delivery of a live female infant from the left occiput transverse position was accomplished.  No cord was noted.  The baby was bulb suctioned.  The abdomen cord was  clamped, cut.  The baby was transferred to the awaiting pediatrician who assigned Apgars of 9 and 9 at one and five minutes.  The placenta was manually removed.  Uterine cavity was cleaned of debris.  Uterine incision had no extension. Uterine incision was closed with a single layer of 0-Monocryl running lock stitch.  The bladder was further tried to be dissected down without much success.  The left ovary could not be seen.  The left tube was noted with a blunted fimbriated end.  The right tube was normal.  Right ovary was normal.  The midportion of both fallopian tube was grasped with a Babcock.  The underlying mesosalpinx was opened with cautery. The intervening segment was tied with 0-chromic sutures proximally and distally and the intervening segment of fallopian tube was removed.  The abdomen was then copiously irrigated and suctioned.  Good hemostasis was noted on the uterine incision and therefore a 2nd layer was not placed due to the thinness of the lower uterine segment.  The parietal peritoneum was closed with 2-0 Vicryl.  Rectus fascia was closed with 0- Vicryl x2.  The subcutaneous area was irrigated.  Small bleeders cauterized.  Interrupted 2-0 plain sutures placed and the skin approximated using Ethicon staples.  SPECIMENS:  Placenta not sent to Pathology.  Portion of the right and left fallopian tubes sent to Pathology.  ESTIMATED BLOOD LOSS:  250 mL.  INTRAOPERATIVE FLUIDS:  4 L.  URINE OUTPUT:  100 mL.  Slightly blood-tinged urine, clearing at the end of the case.  Sponge and instrument counts x2 was correct.  COMPLICATIONS:  None.  Weight of the baby was 7 pounds, 8 ounces.  The patient tolerated procedure well, was transferred to the recovery room in a stable condition.     Maxie Better, M.D.     [place]/MEDQ  D:  02/03/2011  T:  02/03/2011  Job:  [id number]

## 2011-02-03 NOTE — Transfer of Care (Signed)
Immediate Anesthesia Transfer of Care Note  Patient: [name] [name]  Procedure(s) Performed:  CESAREAN SECTION WITH BILATERAL TUBAL LIGATION - Repeat C/S Delivery Baby Boy @ [phone], Apgars 9/9, Bilateral Tubal Ligation  Patient Location: PACU  Anesthesia Type: Spinal  Level of Consciousness: awake, alert  and oriented  Airway & Oxygen Therapy: Patient Spontanous Breathing  Post-op Assessment: Report given to PACU RN and Post -op Vital signs reviewed and stable  Post vital signs: Reviewed and stable  Complications: No apparent anesthesia complications

## 2011-02-03 NOTE — Anesthesia Procedure Notes (Addendum)
Spinal Block  Patient location during procedure: OR Start time: 02/03/2011 7:30 AM Staffing Anesthesiologist: [place] A. Performed by: anesthesiologist  Preanesthetic Checklist Completed: patient identified, site marked, surgical consent, pre-op evaluation, timeout performed, IV checked, risks and benefits discussed and monitors and equipment checked Spinal Block Patient position: sitting Prep: site prepped and draped and DuraPrep Patient monitoring: heart rate, cardiac monitor, continuous pulse ox and blood pressure Approach: midline Location: L3-4 Injection technique: single-shot Needle Needle type: Sprotte  Needle gauge: 24 G Needle length: 9 cm Needle insertion depth: 5 cm Assessment Sensory level: T4 Additional Notes Patient tolerated procedure well. Adequate sensory level.

## 2011-02-03 NOTE — Anesthesia Preprocedure Evaluation (Signed)
Anesthesia Evaluation  Patient identified by MRN, date of birth, ID band Patient awake    Reviewed: Allergy & Precautions, H&P , Patient's Chart, lab work & pertinent test results  Airway Mallampati: III TM Distance: >3 FB Neck ROM: full    Dental No notable dental hx.    Pulmonary neg pulmonary ROS,  clear to auscultation  Pulmonary exam normal       Cardiovascular neg cardio ROS regular Normal    Neuro/Psych  Headaches, Negative Neurological ROS  Negative Psych ROS   GI/Hepatic negative GI ROS, Neg liver ROS, GERD-  ,  Endo/Other  Negative Endocrine ROS  Renal/GU negative Renal ROS     Musculoskeletal   Abdominal   Peds  Hematology negative hematology ROS (+)   Anesthesia Other Findings   Reproductive/Obstetrics (+) Pregnancy                           Anesthesia Physical Anesthesia Plan  ASA: II  Anesthesia Plan: Spinal   Post-op Pain Management:    Induction:   Airway Management Planned:   Additional Equipment:   Intra-op Plan:   Post-operative Plan:   Informed Consent: I have reviewed the patients History and Physical, chart, labs and discussed the procedure including the risks, benefits and alternatives for the proposed anesthesia with the patient or authorized representative who has indicated his/her understanding and acceptance.     Plan Discussed with:   Anesthesia Plan Comments:         Anesthesia Quick Evaluation

## 2011-02-03 NOTE — Anesthesia Postprocedure Evaluation (Signed)
  Anesthesia Post-op Note  Patient: [name] [name]  Procedure(s) Performed:  CESAREAN SECTION WITH BILATERAL TUBAL LIGATION - Repeat C/S Delivery Baby Boy @ [phone], Apgars 9/9, Bilateral Tubal Ligation  Patient Location: PACU  Anesthesia Type: Spinal  Level of Consciousness: awake, alert  and oriented  Airway and Oxygen Therapy: Patient Spontanous Breathing  Post-op Pain: none  Post-op Assessment: Post-op Vital signs reviewed, Patient's Cardiovascular Status Stable, Respiratory Function Stable, Patent Airway, No signs of Nausea or vomiting, Pain level controlled, No headache, No backache, No residual numbness and No residual motor weakness  Post-op Vital Signs: Reviewed and stable  Complications: No apparent anesthesia complications

## 2011-02-03 NOTE — Brief Op Note (Signed)
02/03/2011  8:26 AM  PATIENT:  [name] [name]  35 y.o. female  PRE-OPERATIVE DIAGNOSIS:  Previous C-Section x 2, Desires Sterilization, term gestation  POST-OPERATIVE DIAGNOSIS:  Previous C-Section x 2, Desires Sterilization, term gestation  PROCEDURE:  Procedure(s): REPEAT CESAREAN SECTION WITH MODIFIED POMEROY BILATERAL TUBAL LIGATION  SURGEON:  Surgeon(s): Ignacia Gentzler Cathie Beams, MD  PHYSICIAN ASSISTANT:   ASSISTANTS: Genia Del, MD  ANESTHESIA:   spinal  EBL:  Total I/O In: 3000 [I.V.:3000] Out: 100 [Urine:100]  BLOOD ADMINISTERED:none FINDINGS;  Live female Apgar 9/9 nl tubes, left ov not seen right ov normal, thin LUS  DRAINS: none   LOCAL MEDICATIONS USED:  MARCAINE 10CC  SPECIMEN:  Source of Specimen:  portion of right and left tube to path. placenta not sent  DISPOSITION OF SPECIMEN:  PATHOLOGY  COUNTS:  YES  TOURNIQUET:  * No tourniquets in log *  DICTATION: .Other Dictation: Dictation Number   PLAN OF CARE: Admit to inpatient   PATIENT DISPOSITION:  PACU - hemodynamically stable.   Delay start of Pharmacological VTE agent (>24hrs) due to surgical blood loss or risk of bleeding:  no

## 2011-02-03 NOTE — Progress Notes (Signed)
UR chart review completed.  

## 2011-02-04 ENCOUNTER — Encounter (HOSPITAL_COMMUNITY): Payer: Self-pay

## 2011-02-04 LAB — CBC
HCT: 35 % — ABNORMAL LOW (ref 36.0–46.0)
MCV: 87.9 fL (ref 78.0–100.0)
Platelets: 161 10*3/uL (ref 150–400)
RBC: 3.98 MIL/uL (ref 3.87–5.11)
RDW: 14.2 % (ref 11.5–15.5)
WBC: 8.9 10*3/uL (ref 4.0–10.5)

## 2011-02-04 NOTE — Anesthesia Postprocedure Evaluation (Signed)
  Anesthesia Post-op Note  Patient: [name] [name]  Procedure(s) Performed:  CESAREAN SECTION WITH BILATERAL TUBAL LIGATION - Repeat C/S Delivery Baby Boy @ [phone], Apgars 9/9, Bilateral Tubal Ligation  Patient Location: PACU and Mother/Baby  Anesthesia Type: Spinal  Level of Consciousness: awake, alert , oriented and patient cooperative  Airway and Oxygen Therapy: Patient Spontanous Breathing  Post-op Pain: mild  Post-op Assessment: Post-op Vital signs reviewed, No signs of Nausea or vomiting and Pain level controlled  Post-op Vital Signs: Reviewed and stable  Complications: No apparent anesthesia complications

## 2011-02-04 NOTE — Progress Notes (Signed)
  POD # 1  Subjective: Pt reports feeling well/ Pain controlled with prescription NSAID's including ibuprofen (Motrin) Tolerating po/ Foley d/c'ed and has voided x 2 since removed/ No n/v/Flatus sm amt Activity: out of bed and ambulate Bleeding is light Newborn info:  Information for the patient's newborn:  Martina, Brodbeck Cheyene [[id number]]  female  / circ undecided at present/ Feeding: breast   Objective: VS: Blood pressure 117/73, pulse 73, temperature 98.2 F (36.8 C), temperature source Oral, resp. rate 20  I&O: +3000  LABS:    Basename 02/04/11 0605  WBC 8.9  HGB 11.3*  HCT 35.0*  PLT 161    Blood type: --/--/O POS (11/05 0620) Rubella:   Immune   Physical Exam:  General: alert, cooperative and no distress CV: Regular rate and rhythm Resp: clear Abdomen: soft, nontender, normal bowel sounds Uterine Fundus: firm, below umbilicus, nontender Incision: closed with staples; well approximated; C/D/I Perineum: not inspected Lochia: minimal Ext: Homans sign is negative, no sign of DVT and no edema, redness or tenderness in the calves or thighs    A/P: POD # 1/ [id number] Doing well Continue routine post op orders

## 2011-02-05 MED ORDER — IBUPROFEN 600 MG PO TABS: 600.0000 mg | ORAL_TABLET | Freq: Four times a day (QID) | ORAL | Status: DC | PRN

## 2011-02-05 MED ORDER — OXYCODONE-ACETAMINOPHEN 5-325 MG PO TABS: 1.0000 | ORAL_TABLET | Freq: Four times a day (QID) | ORAL | Status: DC | PRN

## 2011-02-05 MED ORDER — INFLUENZA VIRUS VACC SPLIT PF IM SUSP
0.5000 mL | Freq: Once | INTRAMUSCULAR | Status: AC
  Administered 2011-02-05: 0.5 mL via INTRAMUSCULAR
  Filled 2011-02-05: qty 0.5

## 2011-02-05 NOTE — Progress Notes (Signed)
  S:         Reports feeling well - minimal discomfort             Tolerating po intake / no nausea / no vomiting / + flatus / no BM             Bleeding is light             Pain controlled withprescription NSAID's including motrin only             Up ad lib / ambulatory  Newborn breast feeding  / Circumcision done   O:  A & O x 3 NAD             VS: Blood pressure 144/81, pulse 82, temperature 98.5 F (36.9 C), temperature source Oral, resp. rate 18, weight 72.576 kg (160 lb), last menstrual period 05/05/2010, SpO2 96.00%, unknown if currently breastfeeding.  LABS:  Lab Results  Component Value Date   WBC 8.9 02/04/2011   HGB 11.3* 02/04/2011   HCT 35.0* 02/04/2011   MCV 87.9 02/04/2011   PLT 161 02/04/2011     I&O: I/O last 3 completed shifts: In: 2420 [P.O.:1920; I.V.:500] Out: 2300 [Urine:2300]      Lungs: Clear and unlabored  Heart: regular rate and rhythm / no mumurs  Abdomen: soft, non-tender, non-distended, active bowel sounds             Fundus: firm, non-tender, U-1             Dressing OFF              Incision:  approximated with staples / no erythema / no ecchymosis / no drainage  Perineum: no edema  Lochia: light  Extremities: trace edema, no calf pain or tenderness, negative Homans  A:        POD # 2 S/P repeat cesarean section            Stable status  P:        Routine postoperative care              Discharge home today     [name] [name],TANYA 02/05/2011, 10:00 AM

## 2011-02-05 NOTE — Discharge Summary (Signed)
Obstetric Discharge Summary Reason for Admission: cesarean section Prenatal Procedures: none Intrapartum Procedures: cesarean: low cervical, transverse and tubal ligation Postpartum Procedures: none Complications-Operative and Postpartum: none Hemoglobin  Date Value Range Status  02/04/2011 11.3* 12.0-15.0 (g/dL) Final     HCT  Date Value Range Status  02/04/2011 35.0* 36.0-46.0 (%) Final    Discharge Diagnoses:  Term Pregnancy-delivered and Cesarean section - repeat with bilateral tubal sterilization  Discharge Information: Date: 02/05/2011 Activity: pelvic rest and postoperative restrictions x 2 weeks Diet: routine Medications: PNV, Ibuprofen and Percocet Condition: stable Instructions: refer to practice specific booklet Discharge to: home Follow-up Information    Follow up with Kiam Bransfield A, MD. Make an appointment in 6 weeks.   Contact information:   [address] [place] [address] [phone]        Staple removal at [facility] Monday - call for APT  Newborn Data: Live born female  Birth Weight: 7 lb 8.3 oz (3410 g) APGAR: 9, 9  Home with mother.  [name] [name],[name] [name] 02/05/2011, 10:04 AM

## 2011-02-14 ENCOUNTER — Observation Stay (HOSPITAL_COMMUNITY)
Admission: AD | Admit: 2011-02-14 | Discharge: 2011-02-15 | Disposition: A | Discharge status: Home / Self Care | Payer: Medicaid Other | Source: Ambulatory Visit | Attending: Obstetrics and Gynecology | Admitting: Obstetrics and Gynecology

## 2011-02-14 ENCOUNTER — Encounter (HOSPITAL_COMMUNITY): Payer: Self-pay | Admitting: Obstetrics and Gynecology

## 2011-02-14 ENCOUNTER — Other Ambulatory Visit: Payer: Self-pay | Admitting: Obstetrics and Gynecology

## 2011-02-14 DIAGNOSIS — O165 Unspecified maternal hypertension, complicating the puerperium: Secondary | ICD-10-CM

## 2011-02-14 DIAGNOSIS — O135 Gestational [pregnancy-induced] hypertension without significant proteinuria, complicating the puerperium: Principal | ICD-10-CM | POA: Insufficient documentation

## 2011-02-14 HISTORY — DX: Unspecified maternal hypertension, complicating the puerperium: O16.5

## 2011-02-14 LAB — COMPREHENSIVE METABOLIC PANEL
ALT: 12 U/L (ref 0–35)
AST: 18 U/L (ref 0–37)
Albumin: 3.3 g/dL — ABNORMAL LOW (ref 3.5–5.2)
Alkaline Phosphatase: 127 U/L — ABNORMAL HIGH (ref 39–117)
BUN: 13 mg/dL (ref 6–23)
CO2: 29 mEq/L (ref 19–32)
Calcium: 9.7 mg/dL (ref 8.4–10.5)
Chloride: 102 mEq/L (ref 96–112)
Creatinine, Ser: 0.88 mg/dL (ref 0.50–1.10)
GFR calc Af Amer: >90 mL/min (ref >90)
GFR calc non Af Amer: 85 mL/min — ABNORMAL LOW (ref >90)
Glucose, Bld: 76 mg/dL (ref 70–99)
Potassium: 3.4 mEq/L — ABNORMAL LOW (ref 3.5–5.1)
Sodium: 139 mEq/L (ref 135–145)
Total Bilirubin: 0.4 mg/dL (ref 0.3–1.2)
Total Protein: 6.6 g/dL (ref 6.0–8.3)

## 2011-02-14 LAB — CBC
HCT: 40.4 % (ref 36.0–46.0)
Hemoglobin: 13 g/dL (ref 12.0–15.0)
MCH: 28.3 pg (ref 26.0–34.0)
MCHC: 32.2 g/dL (ref 30.0–36.0)
MCV: 88 fL (ref 78.0–100.0)
Platelets: 246 10*3/uL (ref 150–400)
RBC: 4.59 MIL/uL (ref 3.87–5.11)
RDW: 14.2 % (ref 11.5–15.5)
WBC: 5.6 10*3/uL (ref 4.0–10.5)

## 2011-02-14 LAB — URIC ACID: Uric Acid, Serum: 4.3 mg/dL (ref 2.4–7.0)

## 2011-02-14 MED ORDER — ONDANSETRON HCL 4 MG/2ML IJ SOLN: 4.0000 mg | Freq: Four times a day (QID) | INTRAMUSCULAR | Status: DC | PRN

## 2011-02-14 MED ORDER — ZOLPIDEM TARTRATE 5 MG PO TABS: 5.0000 mg | ORAL_TABLET | Freq: Every evening | ORAL | Status: DC | PRN

## 2011-02-14 MED ORDER — GUAIFENESIN 100 MG/5ML PO SOLN: 15.0000 mL | ORAL | Status: DC | PRN

## 2011-02-14 MED ORDER — ONDANSETRON HCL 4 MG PO TABS: 4.0000 mg | ORAL_TABLET | Freq: Four times a day (QID) | ORAL | Status: DC | PRN

## 2011-02-14 MED ORDER — IBUPROFEN 800 MG PO TABS
800.0000 mg | ORAL_TABLET | Freq: Three times a day (TID) | ORAL | Status: DC | PRN
  Administered 2011-02-14: 800 mg via ORAL
  Filled 2011-02-14: qty 1

## 2011-02-14 MED ORDER — HYDROCHLOROTHIAZIDE 25 MG PO TABS
25.0000 mg | ORAL_TABLET | Freq: Once | ORAL | Status: AC
  Administered 2011-02-14: 25 mg via ORAL
  Filled 2011-02-14: qty 1

## 2011-02-14 MED ORDER — SODIUM CHLORIDE 0.9 % IV SOLN
250.0000 mL | INTRAVENOUS | Status: DC
  Administered 2011-02-14: 18:00:00 via INTRAVENOUS

## 2011-02-14 MED ORDER — MENTHOL 3 MG MT LOZG: 1.0000 | LOZENGE | OROMUCOSAL | Status: DC | PRN

## 2011-02-14 MED ORDER — SODIUM CHLORIDE 0.9 % IJ SOLN
INTRAMUSCULAR | Status: AC
  Filled 2011-02-14: qty 3

## 2011-02-14 MED ORDER — SENNOSIDES-DOCUSATE SODIUM 8.6-50 MG PO TABS: 2.0000 | ORAL_TABLET | Freq: Every day | ORAL | Status: DC | PRN

## 2011-02-14 MED ORDER — SODIUM CHLORIDE 0.9 % IJ SOLN: 3.0000 mL | INTRAMUSCULAR | Status: DC | PRN

## 2011-02-14 MED ORDER — SODIUM CHLORIDE 0.9 % IJ SOLN
3.0000 mL | Freq: Two times a day (BID) | INTRAMUSCULAR | Status: DC
  Administered 2011-02-14: 3 mL via INTRAVENOUS

## 2011-02-14 MED ORDER — HYDROCHLOROTHIAZIDE 25 MG PO TABS
25.0000 mg | ORAL_TABLET | Freq: Every day | ORAL | Status: DC
  Filled 2011-02-14: qty 1

## 2011-02-14 MED ORDER — NIFEDIPINE ER 30 MG PO TB24
30.0000 mg | ORAL_TABLET | Freq: Every day | ORAL | Status: DC
  Administered 2011-02-15: 30 mg via ORAL
  Filled 2011-02-14: qty 1

## 2011-02-14 MED ORDER — NIFEDIPINE ER OSMOTIC RELEASE 30 MG PO TB24
30.0000 mg | ORAL_TABLET | Freq: Once | ORAL | Status: AC
  Administered 2011-02-14: 30 mg via ORAL
  Filled 2011-02-14: qty 1

## 2011-02-14 NOTE — Progress Notes (Signed)
Scheduled repeat c/s on 11/05.  No hx of BP problem with preg or other pregnancies. Home nurse there to see son, checked pt's BP was elevated.  Had HA on Tue/ Wed, slight this morning. Ibuprofen is covering pain. No bleeding. Baby boy is doing great- " other kids LOVE him".  Pumping and feeding by bottle, doing well is gaining.  No headache currently, no visual changes or epigastric pain.  Denies increased swelling.

## 2011-02-14 NOTE — Progress Notes (Signed)
GAVE DOUBLE BREAST PUMP-  TO  PUMP WHILE WAITING FOR ROOM

## 2011-02-14 NOTE — H&P (Signed)
[name] [name] is a 35 y.o. female presenting for BP evaluation after Smart start RN found pt to have BP 160/90 at home. Pt was without complaint. She noted that her leg swelling had resolved and pt has never had elevation of BP with other pregnancy. Pt feels fine. Denies H/a, visual changes or epigastric pain S/P repeat C/S, TL 10/5 History OB History    Grav Para Term Preterm Abortions TAB SAB Ect Mult Living   3 3 2 1  0     3     Past Medical History  Diagnosis Date  . GERD (gastroesophageal reflux disease)     tums prn  . Postpartum hypertension (Day 11; post rpt c/s) 02/14/2011   Past Surgical History  Procedure Date  . Cesarean section     x2   Family History: family history is not on file. Social History:  reports that she has never smoked. She does not have any smokeless tobacco history on file. She reports that she does not drink alcohol or use illicit drugs.  ROSneg   Blood pressure 172/81, pulse 64, temperature 99 F (37.2 C), temperature source Oral, resp. rate 20, height 5\' 4"  (1.626 m), weight 67.132 kg (148 lb), last menstrual period 05/05/2010, SpO2 99.00%, unknown if currently breastfeeding. Exam Physical Exam  Constitutional: She appears well-developed and well-nourished.  Eyes: EOM are normal.  Neck: Neck supple.  Cardiovascular: Regular rhythm.   Respiratory: Breath sounds normal.  GI: Soft.       Incision well approximated (-) erythema/induration/exudate  Musculoskeletal: She exhibits no edema.  Skin: Skin is warm and dry.  Psychiatric: She has a normal mood and affect.    Prenatal labs: ABO, Rh: --/--/O POS (11/05 1610) Antibody: NEG (11/05 0620) Rubella:   RPR: NON REACTIVE (11/01 1107)  HBsAg: Negative (05/15 0000)  HIV: Non-reactive (05/15 0000)  GBS:    PIH labs nl  Assessment/Plan: PP HTN  POD #11  P) Observation. Start Procardia XL  Lenyx Boody A 02/14/2011, 5:54 PM

## 2011-02-14 NOTE — Progress Notes (Signed)
PT SAYS SHE HAD REPEAT C/S ON 02-03-2011-   NOW - NO LOCHIA. NO PAIN. NO H/A. NO CRAMPING.   SAYS HOME HEALTH NURSE CAME TO HOUSE YESTERDAY TO CHECK BABY - AND WHILE THERE  CHECKED PT'S BP- ELEVATED. WENT TO OFFICE TODAY- AND WAS TOLD TO COME HERE.

## 2011-02-15 LAB — MRSA PCR SCREENING: MRSA by PCR: NEGATIVE

## 2011-02-15 MED ORDER — NIFEDIPINE ER 30 MG PO TB24: 30.0000 mg | ORAL_TABLET | Freq: Every day | ORAL | Status: AC

## 2011-02-15 NOTE — Progress Notes (Signed)
S: feels Fine. deneis any PIH sx  O: BP 155/93 (151-162/89-96) Afebrile. P 66  Lungs: clear to A Cor: RRR Abd: soft nontender Extremity: no edema  A/P PP HTN   P) d/c home. Procardia XL F/u Wednesday office for repeat BP. PIH warning signs reviewed

## 2011-02-15 NOTE — Discharge Summary (Signed)
Physician Discharge Summary  Patient ID: [name] [name] MRN: [id number] DOB/AGE: [birth year] 35 y.o.  Admit date: 02/14/2011 Discharge date: 02/15/2011  Admission Diagnoses: PP hypertension. POD #11 Rpt C/S, TL  Discharge Diagnoses:  PP HTN Principal Problem:  *Postpartum hypertension (Day 11; post rpt c/s)   Discharged Condition: stable  Hospital Course: pt was given procardia XL.   Consults: none  Significant Diagnostic Studies: labs: PIH labs nl  Treatments: oral antihypertensive med  Discharge Exam: Blood pressure 155/93, pulse 76, temperature 98 F (36.7 C), temperature source Oral, resp. rate 18, height 5\' 4"  (1.626 m), weight 66.361 kg (146 lb 4.8 oz), last menstrual period 05/05/2010, SpO2 99.00%, unknown if currently breastfeeding. General appearance: alert, cooperative and no distress Resp: clear to auscultation bilaterally Cardio: regular rate and rhythm, S1, S2 normal, no murmur, click, rub or gallop GI: soft, non-tender; bowel sounds normal; no masses,  no organomegaly  Disposition: Home or Self Care BP check office Wednesday  Discharge Orders    Future Orders Please Complete By Expires   Diet - low sodium heart healthy      Increase activity slowly      Discharge instructions      Comments:   Call if h/a, blurry vision, epigastric pain     Current Discharge Medication List    START taking these medications   Details  NIFEdipine (PROCARDIA-XL/ADALAT CC) 30 MG 24 hr tablet Take 1 tablet (30 mg total) by mouth daily. Qty: 30 tablet, Refills: 4      CONTINUE these medications which have NOT CHANGED   Details  ibuprofen (ADVIL,MOTRIN) 600 MG tablet Take 600 mg by mouth every 6 (six) hours as needed. pain     prenatal vitamin w/FE, FA (PRENATAL 1 + 1) 27-1 MG TABS Take 1 tablet by mouth daily.           Signed: Jeanette Rauth A 02/15/2011, 7:40 AM

## 2011-02-15 NOTE — Progress Notes (Signed)
02/15/11 1000 D/C instructions & prescription given to pt - awaiting NT to escort pt to entrance.

## 2013-07-03 ENCOUNTER — Encounter (HOSPITAL_COMMUNITY): Payer: Self-pay | Admitting: Emergency Medicine

## 2013-07-03 ENCOUNTER — Emergency Department (HOSPITAL_COMMUNITY)
Admission: EM | Admit: 2013-07-03 | Discharge: 2013-07-03 | Disposition: A | Discharge status: Home / Self Care | Payer: Medicaid Other | Source: Home / Self Care | Attending: Emergency Medicine | Admitting: Emergency Medicine

## 2013-07-03 DIAGNOSIS — I10 Essential (primary) hypertension: Secondary | ICD-10-CM

## 2013-07-03 LAB — POCT I-STAT, CHEM 8
BUN: 7 mg/dL (ref 6–23)
Calcium, Ion: 1.24 mmol/L — ABNORMAL HIGH (ref 1.12–1.23)
Chloride: 101 mEq/L (ref 96–112)
Creatinine, Ser: 0.9 mg/dL (ref 0.50–1.10)
Glucose, Bld: 87 mg/dL (ref 70–99)
HCT: 48 % — ABNORMAL HIGH (ref 36.0–46.0)
Hemoglobin: 16.3 g/dL — ABNORMAL HIGH (ref 12.0–15.0)
Potassium: 3.7 mEq/L (ref 3.7–5.3)
SODIUM: 141 meq/L (ref 137–147)
TCO2: 28 mmol/L (ref 0–100)

## 2013-07-03 MED ORDER — CLONIDINE HCL 0.1 MG PO TABS
ORAL_TABLET | ORAL | Status: AC
  Filled 2013-07-03: qty 1

## 2013-07-03 MED ORDER — AMLODIPINE BESYLATE 5 MG PO TABS
5.0000 mg | ORAL_TABLET | Freq: Every day | ORAL | Status: DC
Start: 2013-07-03 — End: 2014-08-31

## 2013-07-03 MED ORDER — HYDROCHLOROTHIAZIDE 25 MG PO TABS: 25.0000 mg | ORAL_TABLET | Freq: Every day | ORAL | Status: DC

## 2013-07-03 MED ORDER — CLONIDINE HCL 0.1 MG PO TABS
0.2000 mg | ORAL_TABLET | Freq: Once | ORAL | Status: AC
  Administered 2013-07-03: 0.2 mg via ORAL

## 2013-07-03 NOTE — ED Provider Notes (Signed)
Chief Complaint   Chief Complaint  Patient presents with  . Chest Pain    History of Present Illness   [name] [name] is a 38 year old female who went in to see her doctor this past Thursday, 4 days ago because of a rash on her neck. The rash is actually gone by the time she saw her physician. Her blood pressure was found to be elevated, although she cannot recall what the reading was. She's never had high blood pressure before although there is a family history of high blood pressure. This caused her to feel stressed out, nervous, anxious. Her chest is been a little bit tight but she denies any chest pain, pressure, palpitations, dizziness, or syncope. She has had no coughing, wheezing, or shortness of breath. She denies any diaphoresis, nausea, or vomiting. She's had no headaches, blurry vision, ankle edema, or strokelike symptoms. No history of diabetes, hypercholesterolemia, chronic kidney disease, or cigarette smoking.  Review of Systems   Other than as noted above, the patient denies any of the following symptoms: Respiratory:  No coughing, wheezing, or shortness of breath. Cardiac:  No chest pain, tightness, pressure, palpitations, syncope, or edema. Neuro:  No headache, dizziness, blurred vision, weakness, paresthesias, or strokelike symptoms.   PMFSH   Past medical history, family history, social history, meds, and allergies were reviewed.    Physical Examination   Vital signs:  BP 195/124  Pulse 98  Temp(Src) 98.6 F (37 C)  Resp 18  SpO2 100%  LMP 06/08/2013  Breastfeeding? No General:  Alert, oriented, in no distress. Lungs:  Breath sounds clear and equal bilaterally.  No wheezes, rales, or rhonchi. Heart:  Regular rhythm, no gallops, murmers, clicks or rubs.  Abdomen:  Soft and flat.  Nontender, no organomegaly or mass.  No pulsatile midline abdominal mass or bruit. Ext:  No edema, pulses full. Neurological exam:  Alert and oriented.  Speech is clear.  No  pronator drift.  CNs intact.  Labs   Results for orders placed during the hospital encounter of 07/03/13  POCT I-STAT, CHEM 8      Result Value Ref Range   Sodium 141  137 - 147 mEq/L   Potassium 3.7  3.7 - 5.3 mEq/L   Chloride 101  96 - 112 mEq/L   BUN 7  6 - 23 mg/dL   Creatinine, Ser 1.610.90  0.50 - 1.10 mg/dL   Glucose, Bld 87  70 - 99 mg/dL   Calcium, Ion 0.961.24 (*) 1.12 - 1.23 mmol/L   TCO2 28  0 - 100 mmol/L   Hemoglobin 16.3 (*) 12.0 - 15.0 g/dL   HCT 04.548.0 (*) 40.936.0 - 81.146.0 %    EKG Results    Date: 07/03/2013  Rate: 76  Rhythm: normal sinus rhythm  QRS Axis: normal  Intervals: normal  ST/T Wave abnormalities: normal  Conduction Disutrbances:none  Narrative Interpretation: Normal sinus rhythm, normal EKG.  Old EKG Reviewed: none available  Course in Urgent Care Center   She was given clonidine 0.2 mg by mouth.  Assessment   The encounter diagnosis was Hypertension.  Plan   1.  Meds:  The following meds were prescribed:   New Prescriptions   AMLODIPINE (NORVASC) 5 MG TABLET    Take 1 tablet (5 mg total) by mouth daily.   HYDROCHLOROTHIAZIDE (HYDRODIURIL) 25 MG TABLET    Take 1 tablet (25 mg total) by mouth daily.    2.  Patient Education/Counseling:  The patient was given appropriate handouts,  self care instructions, and instructed in symptomatic relief. Specifically discussed salt and sodium restriction, weight control, and exercise.   3.  Follow up:  The patient was told to follow up here if no better in 3 to 4 days, or sooner if becoming worse in any way, and given some red flag symptoms such as severe headache, vision changes, shortness of breath, chest pain or stroke like symptoms which would prompt immediate return.  Followup with her primary care physician in one week.    Reuben Likes, MD 07/03/13 [phone]

## 2013-07-03 NOTE — ED Notes (Signed)
Pt c/o chest tightness and pressure after being told her blood pressure was high a few days ago. She reports she has felt fine but really concerned about her blood pressure. Pt denies SOB or respiratory problems. Pt is alert and oriented. In no acute distress.

## 2013-07-03 NOTE — Discharge Instructions (Signed)
Blood pressure over the ideal can put you at higher risk for stroke, heart disease, and kidney failure.  For this reason, it's important to try to get your blood pressure as close as possible to the ideal.  The ideal blood pressure is 120/80.  Blood pressures from 469-629 systolic over 52-84 diastolic are labeled as "prehypertension."  This means you are at higher risk of developing hypertension in the future.  Blood pressures in this range are not treated with medication, but lifestyle changes are recommended to prevent progression to hypertension.  Blood pressures of 132 and above systolic over 90 and above diastolic are classified as hypertension and are treated with medications.  Lifestyle changes which can benefit both prehypertension and hypertension include the following:   Salt and sodium restriction.  Weight loss.  Regular exercise.  Avoidance of tobacco.  Avoidance of excess alcohol.  The "D.A.S.H" diet.   People with hypertension and prehypertension should limit their salt intake to less than 1500 mg daily.  Reading the nutrition information on the label of many prepared foods can give you an idea of how much sodium you're consuming at each meal.  Remember that the most important number on the nutrition information is the serving size.  It may be smaller than you think.  Try to avoid adding extra salt at the table.  You may add small amounts of salt while cooking.  Remember that salt is an acquired taste and you may get used to a using a whole lot less salt than you are using now.  Using less salt lets the food's natural flavors come through.  You might want to consider using salt substitutes, potassium chloride, pepper, or blends of herbs and spices to enhance the flavor of your food.  Foods that contain the most salt include: processed meats (like ham, bacon, lunch meat, sausage, hot dogs, and breakfast meat), chips, pretzels, salted nuts, soups, salty snacks, canned foods, junk  food, fast food, restaurant food, mustard, pickles, pizza, popcorn, soy sauce, and worcestershire sauce--quite a list!  You might ask, "Is there anything I can eat?"  The answer is, "yes."  Fruits and vegetables are usually low in salt.  Fresh is better than frozen which is better than canned.  If you have canned vegetables, you can cut down on the salt content by rinsing them in tap water 3 times before cooking.     Weight loss is the second thing you can do to lower your blood pressure.  Getting to and maintaining ideal weight will often normalize your blood pressure and allow you to avoid medications, entirely, cut way down on your dosage of medications, or allow to wean off your meds.  (Note, this should only be done under the supervision of your primary care doctor.)  Of course, weight loss takes time and you may need to be on medication in the meantime.  You shoot for a body mass index of 20-25.  When you go to the urgent care or to your primary care doctor, they should calculate your BMI.  If you don't know what it is, ask.  You can calculate your BMI with the following formula:  Weight in pounds x 703/ (height in inches) x (height in inches).  There are many good diets out there: Weight Watchers and the D.A.S.H. Diet are the best, but often, just modifying a few factors can be helpful:  Don't skip meals, don't eat out, and keeping a food diary.  I do not recommend  fad diets or diet pills which often raise blood pressure.    Everyone should get regular exercise, but this is particularly important for people with high blood pressure.  Just about any exercise is good.  The only exercise which may be harmful is lifting extreme heavy weights.  I recommend moderate exercise such as walking for 30 minutes 5 days a week.  Going to the gym for a 50 minute workout 3 times a week is also good.  This amounts to 150 minutes of exercise weekly.   Anyone with high blood pressure should avoid any use of tobacco.   Tobacco use does not elevate blood pressure, but it increases the risk of heart disease and stroke.  If you are interested in quitting, discuss with your doctor how to quit.  If you are not interested in quitting, ask yourself, "What would my life be like in 10 years if I continue to smoke?"  "How will I know when it is time to quit?"  "How would my life be better if I were to quit."   Excess alcohol intake can raise the blood pressure.  The safe alcohol intake is 2 drinks or less per day for men and 1 drink per day or less for women.   There is a very good diet which I recommend that has been designed for people with blood pressure called the D.A.S.H. Diet (dietary approaches to stop hypertension).  It consists of fruits, vegetables, lean meats, low fat dairy, whole grains, nuts and seeds.  It is very low in salt and sodium.  It has also been found to have other beneficial health effects such as lowering cholesterol and helping lose weight.  It has been developed by the [employer] and can be downloaded from the internet without any cost. Just do a Development worker, community on "D.A.S.H. Diet." or go the NIH website (MasterBoxes.it).  There are also cookbooks and diet plans that can be gotten from [place] to help you with this diet.    DASH Diet The DASH diet stands for "Dietary Approaches to Stop Hypertension." It is a healthy eating plan that has been shown to reduce high blood pressure (hypertension) in as little as 14 days, while also possibly providing other significant health benefits. These other health benefits include reducing the risk of breast cancer after menopause and reducing the risk of type 2 diabetes, heart disease, colon cancer, and stroke. Health benefits also include weight loss and slowing kidney failure in patients with chronic kidney disease.  DIET GUIDELINES  Limit salt (sodium). Your diet should contain less than 1500 mg of sodium daily.  Limit refined or processed  carbohydrates. Your diet should include mostly whole grains. Desserts and added sugars should be used sparingly.  Include small amounts of heart-healthy fats. These types of fats include nuts, oils, and tub margarine. Limit saturated and trans fats. These fats have been shown to be harmful in the body. CHOOSING FOODS  The following food groups are based on a 2000 calorie diet. See your Registered Dietitian for individual calorie needs. Grains and Grain Products (6 to 8 servings daily)  Eat More Often: Whole-wheat bread, brown rice, whole-grain or wheat pasta, quinoa, popcorn without added fat or salt (air popped).  Eat Less Often: White bread, white pasta, white rice, cornbread. Vegetables (4 to 5 servings daily)  Eat More Often: Fresh, frozen, and canned vegetables. Vegetables may be raw, steamed, roasted, or grilled with a minimal amount of fat.  Eat  Less Often/Avoid: Creamed or fried vegetables. Vegetables in a cheese sauce. °Fruit (4 to 5 servings daily) °· Eat More Often: All fresh, canned (in natural juice), or frozen fruits. Dried fruits without added sugar. One hundred percent fruit juice (½ cup [237 mL] daily). °· Eat Less Often: Dried fruits with added sugar. Canned fruit in light or heavy syrup. °Lean Meats, Fish, and Poultry (2 servings or less daily. One serving is 3 to 4 oz [85-114 g]). °· Eat More Often: Ninety percent or leaner ground beef, tenderloin, sirloin. Round cuts of beef, chicken breast, turkey breast. All fish. Grill, bake, or broil your meat. Nothing should be fried. °· Eat Less Often/Avoid: Fatty cuts of meat, turkey, or chicken leg, thigh, or wing. Fried cuts of meat or fish. °Dairy (2 to 3 servings) °· Eat More Often: Low-fat or fat-free milk, low-fat plain or light yogurt, reduced-fat or part-skim cheese. °· Eat Less Often/Avoid: Milk (whole, 2%). Whole milk yogurt. Full-fat cheeses. °Nuts, Seeds, and Legumes (4 to 5 servings per week) °· Eat More Often: All without added  salt. °· Eat Less Often/Avoid: Salted nuts and seeds, canned beans with added salt. °Fats and Sweets (limited) °· Eat More Often: Vegetable oils, tub margarines without trans fats, sugar-free gelatin. Mayonnaise and salad dressings. °· Eat Less Often/Avoid: Coconut oils, palm oils, butter, stick margarine, cream, half and half, cookies, candy, pie. °FOR MORE INFORMATION °The Dash Diet Eating Plan: www.dashdiet.org °Document Released: 03/06/2011 Document Revised: 06/09/2011 Document Reviewed: 03/06/2011 °ExitCare® Patient Information ©2014 [employer]. ° °

## 2013-07-21 ENCOUNTER — Emergency Department (HOSPITAL_COMMUNITY)
Admission: EM | Admit: 2013-07-21 | Discharge: 2013-07-21 | Discharge status: Absent without leave | Payer: Medicaid Other | Source: Home / Self Care

## 2013-07-21 ENCOUNTER — Other Ambulatory Visit: Payer: Self-pay | Admitting: Family Medicine

## 2013-07-21 ENCOUNTER — Ambulatory Visit
Admission: RE | Admit: 2013-07-21 | Discharge: 2013-07-21 | Disposition: A | Discharge status: Home / Self Care | Payer: Medicaid Other | Source: Ambulatory Visit | Attending: Family Medicine | Admitting: Family Medicine

## 2013-07-21 DIAGNOSIS — M549 Dorsalgia, unspecified: Secondary | ICD-10-CM

## 2013-07-21 DIAGNOSIS — M25552 Pain in left hip: Secondary | ICD-10-CM

## 2014-01-30 ENCOUNTER — Encounter (HOSPITAL_COMMUNITY): Payer: Self-pay | Admitting: Emergency Medicine

## 2014-03-04 ENCOUNTER — Encounter (HOSPITAL_COMMUNITY): Payer: Self-pay | Admitting: Emergency Medicine

## 2014-03-04 ENCOUNTER — Emergency Department (HOSPITAL_COMMUNITY): Payer: Medicaid Other

## 2014-03-04 ENCOUNTER — Other Ambulatory Visit: Payer: Self-pay

## 2014-03-04 ENCOUNTER — Encounter (HOSPITAL_COMMUNITY): Payer: Self-pay

## 2014-03-04 ENCOUNTER — Emergency Department (HOSPITAL_COMMUNITY)
Admission: EM | Admit: 2014-03-04 | Discharge: 2014-03-04 | Disposition: A | Discharge status: Nursing Home (Includes ICF) | Payer: Medicaid Other | Source: Home / Self Care | Attending: Emergency Medicine | Admitting: Emergency Medicine

## 2014-03-04 ENCOUNTER — Emergency Department (HOSPITAL_COMMUNITY)
Admission: EM | Admit: 2014-03-04 | Discharge: 2014-03-04 | Disposition: A | Discharge status: Home / Self Care | Payer: Medicaid Other | Attending: Emergency Medicine | Admitting: Emergency Medicine

## 2014-03-04 DIAGNOSIS — Z8719 Personal history of other diseases of the digestive system: Secondary | ICD-10-CM | POA: Diagnosis not present

## 2014-03-04 DIAGNOSIS — R51 Headache: Secondary | ICD-10-CM | POA: Insufficient documentation

## 2014-03-04 DIAGNOSIS — Z3202 Encounter for pregnancy test, result negative: Secondary | ICD-10-CM | POA: Diagnosis not present

## 2014-03-04 DIAGNOSIS — R079 Chest pain, unspecified: Secondary | ICD-10-CM

## 2014-03-04 DIAGNOSIS — R0789 Other chest pain: Secondary | ICD-10-CM

## 2014-03-04 DIAGNOSIS — R519 Headache, unspecified: Secondary | ICD-10-CM

## 2014-03-04 LAB — URINALYSIS, ROUTINE W REFLEX MICROSCOPIC
Bilirubin Urine: NEGATIVE
GLUCOSE, UA: NEGATIVE mg/dL
HGB URINE DIPSTICK: NEGATIVE
KETONES UR: NEGATIVE mg/dL
Leukocytes, UA: NEGATIVE
Nitrite: NEGATIVE
Protein, ur: NEGATIVE mg/dL
Specific Gravity, Urine: 1.008 (ref 1.005–1.030)
Urobilinogen, UA: 0.2 mg/dL (ref 0.0–1.0)
pH: 7.5 (ref 5.0–8.0)

## 2014-03-04 LAB — BASIC METABOLIC PANEL
ANION GAP: 14 (ref 5–15)
BUN: 10 mg/dL (ref 6–23)
CALCIUM: 9.2 mg/dL (ref 8.4–10.5)
CHLORIDE: 101 meq/L (ref 96–112)
CO2: 24 mEq/L (ref 19–32)
CREATININE: 0.8 mg/dL (ref 0.50–1.10)
GFR calc Af Amer: >90 mL/min (ref >90)
GFR calc non Af Amer: >90 mL/min (ref >90)
Glucose, Bld: 87 mg/dL (ref 70–99)
Potassium: 4.5 mEq/L (ref 3.7–5.3)
Sodium: 139 mEq/L (ref 137–147)

## 2014-03-04 LAB — I-STAT TROPONIN, ED
TROPONIN I, POC: 0 ng/mL (ref 0.00–0.08)
Troponin i, poc: 0 ng/mL (ref 0.00–0.08)

## 2014-03-04 LAB — CBC
HCT: 42.4 % (ref 36.0–46.0)
Hemoglobin: 13.8 g/dL (ref 12.0–15.0)
MCH: 27 pg (ref 26.0–34.0)
MCHC: 32.5 g/dL (ref 30.0–36.0)
MCV: 83 fL (ref 78.0–100.0)
Platelets: 245 K/uL (ref 150–400)
RBC: 5.11 MIL/uL (ref 3.87–5.11)
RDW: 14.9 % (ref 11.5–15.5)
WBC: 5.3 K/uL (ref 4.0–10.5)

## 2014-03-04 LAB — POC URINE PREG, ED: Preg Test, Ur: NEGATIVE

## 2014-03-04 MED ORDER — ACETAMINOPHEN 325 MG PO TABS
650.0000 mg | ORAL_TABLET | Freq: Once | ORAL | Status: AC
  Administered 2014-03-04: 650 mg via ORAL

## 2014-03-04 MED ORDER — SODIUM CHLORIDE 0.9 % IV SOLN
Freq: Once | INTRAVENOUS | Status: AC
  Administered 2014-03-04: 10:00:00 via INTRAVENOUS

## 2014-03-04 MED ORDER — ASPIRIN 81 MG PO CHEW
324.0000 mg | CHEWABLE_TABLET | Freq: Once | ORAL | Status: AC
  Administered 2014-03-04: 324 mg via ORAL

## 2014-03-04 NOTE — ED Notes (Addendum)
Per [facility]: pt. [name] FlesherWent to urgent care this AM for chest pain starting Wednesday, intermittent with reaching motion, dull pain. Denies radiation. Self resolving. Pt. Began having dull HA Friday. Pt. Hx of HTN, uncontrolled. Was previously prescribed medication but was attempting to control nonpharmacologically. Pt. Denies HA/CP at this time. EMS states pt. Endorses personal stress. Given 4 baby ASA and [facility].

## 2014-03-04 NOTE — ED Provider Notes (Signed)
CSN: [id number]     Arrival date & time 03/04/14  1121 History   First MD Initiated Contact with Patient 03/04/14 1234     Chief Complaint  Patient presents with  . Headache     (Consider location/radiation/quality/duration/timing/severity/associated sxs/prior Treatment) HPI  This is a 38 year old female with postpartum hypertension and GERD who was sent here via EMS from urgent care for evaluation of headache and chest pain. Patient reports dull intermittent headache as well as intermittent episodes of chest discomfort for the past 2 or 3 days. Patient's described the chest discomfort has a tightness sensation to her mid chest which accompanied with lifting up objects and will resolve when she stopped. There is no associated lightheadedness, dizziness, diaphoresis, shortness of breath with the chest discomfort. She described chest discomfort is mild. She also complaining of occasional temporal headache without any light or sound sensitivity and no neck stiffness. She did check her blood pressure and noticed that it was elevated with systolic in 180s. She reported that she has been stressed out about family situation. No associated URI symptoms. She denies any significant family health premature cardiac death, she is not taking any medication for high blood pressure. She is a nonsmoker and never had any cardiac workup in the past. No compressive fever, chills, confusion, numbness or weakness, denies any rash. When she was evaluated at urgent care they recommend her to come to the ER for further evaluation as they thought symptoms may be related to unstable angina. Patient however states that she has been walking her regular basis and denies having any chest discomfort with ambulation. Patient also denies any prior history of PE or DVT, no recent surgery, prolonged bed rest, unilateral leg swelling or calf pain, active cancer, or taking oral contraceptive medication.    Past Medical History  Diagnosis  Date  . GERD (gastroesophageal reflux disease)     tums prn  . 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 Cesarean section      x2   No family history on file. History  Substance Use Topics  . Smoking status: Never Smoker   . Smokeless tobacco: Not on file  . Alcohol Use: No   OB History    Gravida Para Term Preterm AB TAB SAB Ectopic Multiple Living   3 3 2 1  0     3     Review of Systems  All other systems reviewed and are negative.     Allergies  Codeine  Home Medications   Prior to Admission medications   Medication Sig Start Date End Date Taking? Authorizing Provider  ibuprofen (ADVIL,MOTRIN) 200 MG tablet Take 200 mg by mouth every 6 (six) hours as needed for headache.   Yes Historical Provider, MD  amLODipine (NORVASC) 5 MG tablet Take 1 tablet (5 mg total) by mouth daily. Patient not taking: Reported on 03/04/2014 07/03/13   Reuben Likes, MD  hydrochlorothiazide (HYDRODIURIL) 25 MG tablet Take 1 tablet (25 mg total) by mouth daily. Patient not taking: Reported on 03/04/2014 07/03/13   Reuben Likes, MD  NIFEdipine (PROCARDIA-XL/ADALAT CC) 30 MG 24 hr tablet Take 1 tablet (30 mg total) by mouth daily. Patient not taking: Reported on 03/04/2014 02/15/11 02/15/12  Sheronette A Cousins, MD   BP 159/93 mmHg  Pulse 91  Temp(Src) 98.1 F (36.7 C) (Oral)  Resp 18  Ht 5\' 4"  (1.626 m)  Wt 125 lb (56.7 kg)  BMI 21.45 kg/m2  SpO2 100%  LMP 02/16/2014 Physical Exam  Constitutional: She is oriented to person, place, and time. She appears well-developed and well-nourished. No distress.  HENT:  Head: Atraumatic.  Mouth/Throat: Oropharynx is clear and moist.  Eyes: Conjunctivae are normal.  Neck: Neck supple. No JVD present.  No nuchal rigidity  Cardiovascular:  Tachycardia without murmurs rubs or gallops  Pulmonary/Chest: Effort normal and breath sounds normal. No respiratory distress. She exhibits  no tenderness.  Abdominal: Soft. There is no tenderness.  Musculoskeletal: She exhibits no edema.  Neurological: She is alert and oriented to person, place, and time.  Skin: No rash noted.  Psychiatric: She has a normal mood and affect.  Nursing note and vitals reviewed.   ED Course  Procedures (including critical care time)  12:56 PM Patient presents complaining of intermittent chest tightness and discomfort. The symptom is typical for ACS. Symptom may be musculoskeletal. Doubt GERD. Her heart score is 0. She is however tachycardic but states this she is anxious in the hospital setting. She has no other significant risk factor for PE. Workup initiated. Patient reports that she has no active chest pain at this time.  As far as her headache, she has no acute onset thunderclap headache concerning for subarachnoid hemorrhage, no fever or nuchal rigidity to suggest meningitis, and no focal neuro deficit to suggest stroke. She denies having any significant headache at this time. Her blood pressure is elevated. Patient states she normally does not have headaches and given elevated blood pressure and you onset headache I will obtain head CT for further evaluation.   2:47 PM CXR, head CT and the remainder of her labs are reassuring. Pt is sxs free.  ECG without acute ischemic changes.  Plan to have pt f/u with PCP next week for further evauation.  Return precaution discussed.  Pt voice understanding and agrees with plan. Care discussed with Dr. Effie ShyWentz.    Labs Review Labs Reviewed  CBC  BASIC METABOLIC PANEL  URINALYSIS, ROUTINE W REFLEX MICROSCOPIC  I-STAT TROPOININ, ED  POC URINE PREG, ED  I-STAT TROPOININ, ED    Imaging Review Dg Chest 2 View  03/04/2014   CLINICAL DATA:  Acute chest tightness and chest pain  EXAM: CHEST  2 VIEW  COMPARISON:  07/23/2006  FINDINGS: The heart size and mediastinal contours are within normal limits. Both lungs are clear. The visualized skeletal structures are  unremarkable.  IMPRESSION: No active cardiopulmonary disease.   Electronically Signed   By: Ruel Favorsrevor  Shick M.D.   On: 03/04/2014 14:11   Ct Head Wo Contrast  03/04/2014   CLINICAL DATA:  Initial evaluation for headache  EXAM: CT HEAD WITHOUT CONTRAST  TECHNIQUE: Contiguous axial images were obtained from the base of the skull through the vertex without intravenous contrast.  COMPARISON:  None.  FINDINGS: Calvarium is intact.  Visualized portions of the sinuses are clear.  Normal sulcation with no abnormal attenuation identified. No evidence of hemorrhage infarct extra-axial fluid mass or hydrocephalus.  IMPRESSION: Negative head CT   Electronically Signed   By: Esperanza Heiraymond  Rubner M.D.   On: 03/04/2014 14:09     EKG Interpretation None      Date: 03/04/2014  Rate: 91  Rhythm: normal sinus rhythm  QRS Axis: normal  Intervals: normal  ST/T Wave abnormalities: normal  Conduction Disutrbances:none  Narrative Interpretation:   Old EKG Reviewed: none available    MDM   Final diagnoses:  Chest pain  Headache    BP  164/98 mmHg  Pulse 95  Temp(Src) 98.1 F (36.7 C) (Oral)  Resp 14  Ht 5\' 4"  (1.626 m)  Wt 125 lb (56.7 kg)  BMI 21.45 kg/m2  SpO2 100%  LMP 02/16/2014  I have reviewed nursing notes and vital signs. I personally reviewed the imaging tests through PACS system  I reviewed available ER/hospitalization records thought the EMR     Fayrene HelperBowie Phyillis Dascoli, PA-C 03/04/14 1449  Flint MelterElliott L Wentz, MD 03/04/14 [phone]

## 2014-03-04 NOTE — ED Notes (Signed)
C/o headache, and intermittent tightness in chest noticeable with "pulling" per patient.  Denies chest discomfort today.  Patient checked blood pressure yesterday at a pharmacy and reports bp 180/?.Marland Kitchen

## 2014-03-04 NOTE — ED Notes (Signed)
Notified [facility] 

## 2014-03-04 NOTE — ED Provider Notes (Signed)
CSN: [id number]     Arrival date & time 03/04/14  0902 History   First MD Initiated Contact with Patient 03/04/14 [phone]     Chief Complaint  Patient presents with  . Headache  . Chest Pain   (Consider location/radiation/quality/duration/timing/severity/associated sxs/prior Treatment) HPI Comments: Patient states she began experiencing a dull intermittent headache as well as intermittent episodes of chest discomfort over the past 2-3 days. Headaches seem to occur at random. Chest tightness will occur "when I do too much around the house."  Or if she reaches for or lifts multiple objects. Symptoms resolve with rest. No hx of or symptoms of GERD. No associated dyspnea, palpitations, diaphoresis, nausea. No recent URI sx of fevers. No reported family hx of early heart disease. States she decided to check her blood pressure at a local pharmacy and when she found it to be elevated, she thought that this may be contributing to her symptoms. [facility]. Reports herself to be symptom free with the exception of a mild headache at the time of visit today. Has been prescribed anti-HTN meds in the past but chose not to take them thinking she could control her BP with changes in diet.  PCP: [facility] (states this is the office the she is assigned to on her Medicaid card, but she has never been to the practice) Nonsmoker Works as homemaker   Patient is a 38 y.o. female presenting with headaches and chest pain. The history is provided by the patient.  Headache Associated symptoms: no cough   Chest Pain Associated symptoms: headache   Associated symptoms: no cough, no palpitations and no shortness of breath     Past Medical History  Diagnosis Date  . GERD (gastroesophageal reflux disease)     tums prn  . 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 Cesarean section      x2   No family  history on file. History  Substance Use Topics  . Smoking status: Never Smoker   . Smokeless tobacco: Not on file  . Alcohol Use: No   OB History    Gravida Para Term Preterm AB TAB SAB Ectopic Multiple Living   3 3 2 1  0     3     Review of Systems  Constitutional: Negative.   Eyes: Negative.   Respiratory: Positive for chest tightness. Negative for cough, shortness of breath and wheezing.   Cardiovascular: Positive for chest pain. Negative for palpitations and leg swelling.  Gastrointestinal: Negative.   Musculoskeletal: Negative.   Skin: Negative.   Neurological: Positive for headaches.    Allergies  Codeine  Home Medications   Prior to Admission medications   Medication Sig Start Date End Date Taking? Authorizing Provider  amLODipine (NORVASC) 5 MG tablet Take 1 tablet (5 mg total) by mouth daily. 07/03/13   Reuben Likesavid C Keller, MD  hydrochlorothiazide (HYDRODIURIL) 25 MG tablet Take 1 tablet (25 mg total) by mouth daily. 07/03/13   Reuben Likesavid C Keller, MD  NIFEdipine (PROCARDIA-XL/ADALAT CC) 30 MG 24 hr tablet Take 1 tablet (30 mg total) by mouth daily. 02/15/11 02/15/12  Sheronette A Cousins, MD  prenatal vitamin w/FE, FA (PRENATAL 1 + 1) 27-1 MG TABS Take 1 tablet by mouth daily.      Historical Provider, MD   BP 166/120 mmHg  Pulse 109  Temp(Src) 98.5 F (36.9 C) (Oral)  Resp 12  SpO2  100%  LMP 02/16/2014 Physical Exam  Constitutional: She is oriented to person, place, and time. Vital signs are normal. She appears well-developed and well-nourished. She is cooperative.  HENT:  Head: Normocephalic and atraumatic.  Eyes: Conjunctivae are normal. No scleral icterus.  Neck: No JVD present.  Cardiovascular: Normal rate, regular rhythm and normal heart sounds.   Pulmonary/Chest: Effort normal and breath sounds normal.  Musculoskeletal: Normal range of motion.  Neurological: She is alert and oriented to person, place, and time.  Skin: Skin is warm and dry. No rash noted. No  erythema.  Psychiatric: She has a normal mood and affect. Her behavior is normal.  Nursing note and vitals reviewed.   ED Course  Procedures (including critical care time) Labs Review Labs Reviewed - No data to display  Imaging Review No results found.   MDM   1. Chest tightness    ECG: NSR @ 95 bpm with non-specific ST changes. No ectopy. No change from 07/13/2013.  No indications of ACS. This is patient's second visit for chest tightness with associated HTN (previous visit 06/2013 with no follow up) with escalation of symptoms over past 2-3 days. Case discussed with Dr. Lorenz CoasterKeller and expresses concern for unstable or recurrent angina. [facility].     Ria ClockJennifer Lee H Dillin Lofgren, [place] 03/04/14 [phone]

## 2014-03-04 NOTE — Discharge Instructions (Signed)
Please use resource below to find a primary care provider for close follow up next week.  Return if your condition worsen.    Emergency Department Resource Guide 1) Find a Doctor and Pay Out of Pocket Although you won't have to find out who is covered by your insurance plan, it is a good idea to ask around and get recommendations. You will then need to call the office and see if the doctor you have chosen will accept you as a new patient and what types of options they offer for patients who are self-pay. Some doctors offer discounts or will set up payment plans for their patients who do not have insurance, but you will need to ask so you aren't surprised when you get to your appointment.  2) Contact Your Local Health Department Not all health departments have doctors that can see patients for sick visits, but many do, so it is worth a call to see if yours does. If you don't know where your local health department is, you can check in your phone book. The CDC also has a tool to help you locate your state's health department, and many state websites also have listings of all of their local health departments.  3) Find a Walk-in Clinic If your illness is not likely to be very severe or complicated, you may want to try a walk in clinic. These are popping up all over the country in pharmacies, drugstores, and shopping centers. They're usually staffed by nurse practitioners or physician assistants that have been trained to treat common illnesses and complaints. They're usually fairly quick and inexpensive. However, if you have serious medical issues or chronic medical problems, these are probably not your best option.  No Primary Care Doctor: - Call Health Connect at  [phone] - they can help you locate a primary care doctor that  accepts your insurance, provides certain services, etc. - Physician Referral Service- [phone]  Chronic Pain Problems: Organization         Address  Phone    Notes  [facility]  [phone]) [phone] Patients need to be referred by their primary care doctor.   Medication Assistance: Organization         Address  Phone   Notes  [facility] [address] [place], [place] [address] [phone](336) [phone] --Must be a resident of [place] -- Must have NO insurance coverage whatsoever (no Medicaid/ Medicare, etc.) -- The pt. MUST have a primary care doctor that directs their care regularly and follows them in the community   MedAssist  [phone]   [employer]  [phone]    Agencies that provide inexpensive medical care: Organization         Address  Phone   Notes  [facility]  [phone]   [facility]    [phone]   [facility] [address] [place], [place] [address] [phone]   [facility] [address], [place] [phone]   Planned Parenthood    [phone]) [phone]   [facility]    [phone]   [facility]  [address], [place] Phone:  [phone], Fax:  [phone]) [phone] Hours of Operation:  9 am - 6 pm, M-F.  Also accepts Medicaid/Medicare and self-pay.  [facility]  [address], [place] Phone: [phone](336)  [phone], Fax: [phone]. Hours of Operation:  8:30 am - 5:30 pm, M-F.  Also accepts Medicaid and self-pay.  [facility] [place] [address] [place] Phone: [phone]   [place], [place], [place] [phone], Ext. [phone] Mondays & Thursdays: 7-9 AM.  First 15 patients are seen on a first come, first serve basis.    [place] Providers:  Organization         Address  Phone   Notes  [facility] [address], [place] [phone] Also accepts self-pay patients.  [facility]  [id number] [place], [place]  [phone]   [place], [address], [place] [phone]   [facility] [address] [place] [phone]   Lucianne Lei [address], [place]   [phone] Only accepts [place] Access [place] patients after they have their name applied to their card.   Self-Pay (no insurance) in [facility]:  Organization         Address  Phone   Notes  Sickle Cell Patients, [facility] [place] [phone]   [facility] [place] [phone]   [facility] [place]  [place], [place], [place] [phone]   [facility]/Dr. Osei-Bonsu  [address], [place] or [address], [place] [phone] Phone number for both Dent and [place] locations is the same.  [facility]gent [facility] and [facility] [address], [place] [phone]   [facility] [address] [place] or [address] [phone]   [facility] [address] [place] [phone], phone; [phone], fax Sees patients 1st and 3rd Saturday of every month.  Must not qualify for public or private insurance (i.e. Medicaid, Medicare, [place] Health Choice, Veterans' Benefits)  Household income should be no more than 200% of the poverty level The clinic cannot treat you if you are pregnant or think you are pregnant  Sexually transmitted diseases are not treated at the clinic.    Dental Care: Organization         Address  Phone  Notes  [facility] [place] [phone] Accepts children up to age 49 who are enrolled in [place] or [place]; pregnant women with a Medicaid card; and children who have  applied for Medicaid or [place] Health Choice, but were declined, whose parents can pay a reduced fee at time of service.  [facility] Department of [facility]  [address], [place] [phone] Accepts children up to age 43 who are enrolled in [place] or [place]; pregnant women with a Medicaid card; and children who have applied for Medicaid or [place] Health Choice, but were declined, whose parents can pay a reduced fee at time of service.  [place] [facility] Access PROGRAM  [place] [phone] Patients are seen by appointment only. Walk-ins are not accepted. [place] will see patients 75 years of age and older. Monday - Tuesday (8am-5pm) Most Wednesdays (8:30-5pm) $30 per visit, cash only  [place] [facility]  [address], [employer]  [place] [phone] Patients are seen by appointment only. Walk-ins are not accepted. [place] will see patients 52 years of age and older. One Wednesday Evening (Monthly: Volunteer Based).  $30 per visit, cash only  [place]  [phone] for adults; Children under age 58, call Graduate Pediatric Dentistry at [phone]. Children aged 77-14, please call [phone] to request a pediatric application.  Dental services are provided in all areas of dental care including fillings, crowns and bridges, complete and partial dentures, implants, gum treatment, root canals, and extractions. Preventive care is also provided. Treatment is provided to both adults and children. Patients are selected via a lottery and there is often a waiting list.   [facility] [address], [place]  [phone] [email or web address]   [facility] [address] [place], [place] [phone], Ext. [phone] Second and Fourth Thursday of each month, opens at 6:30 AM; Clinic ends at 9 AM.  Patients are seen on a first-come first-served basis, and a  limited number are seen during each clinic.   [facility]  [address] Hillard Danker [place], [place] [phone]   Eligibility Requirements You must have lived in [place], [place], or [place] counties for at least the last three months.   You cannot be eligible for state or federal sponsored [employer], including [employer], [place], or [employer].   You generally cannot be eligible for healthcare insurance through your employer.    How to apply: Eligibility screenings are held every Tuesday and Wednesday afternoon from 1:00 pm until 4:00 pm. You do not need an appointment for the interview!  [facility] [address], [place], [place]   [place]  [place] Department  [place]  [phone]    Behavioral Health Resources in the Community: Intensive Outpatient Programs Organization         Address  Phone  Notes  [place]. [address], [place], [place] [phone]   [facility] [address] [place], [place]   ADS: [facility] [address] [place], [place]   [place] [address]  [place], [place] or [phone]   Substance Abuse Resources Organization         Address  Phone  Notes  Al[facility] and Drug Services  [phone]   [place]  [phone]   [facility] [place]   Chinita Pester  [phone]   Residential & Outpatient Substance Abuse Program  [phone]   Psychological Services Organization         Address  Phone  Notes  [facility] [place]  [place]  [phone]   [place] [address] [place] or [phone]    Mobile Crisis Teams Organization          Address  Phone  Notes  [facility], Mobile Crisis Care Unit  [phone]   [facility]  [address] [place], [place]   [place] Levels [address] [place] [phone]    Self-Help/Support Groups Organization         Address  Phone             Notes  [place]. of [place] - variety of support groups  [phone]- H3156881 Call for more information  Narcotics Anonymous (NA), [facility] [address], [employer] [place]  2 meetings at this location   Residential Facilities manager         Address  Phone  Notes  [facility] [place],    [place]  [phone]   [facility]  [address], [place] [id number], [place], [place]   [place], [place] [phone] Admissions: 8am-3pm M-F  Incentives Substance [place] [address]    [employer], [place] [phone]   [facility] [address] [place], [place], [place]   [facility] [address],  [place], [place]   [facility] - Intensive Outpatient [address] Kristeen [address], [place], [place]   [facility] ([place].,  [place], [place] [phone] or [phone]   [facility] (RTS) [address], [place], [place] Accepts Medicaid  [facility] [address]  [place] [phone] Substance Abuse/Addiction Treatment   [facility] Organization         Address  Phone  Notes  [facility]  [phone]   Domenic Schwab, PhD [address] Arlis Porta [place], [place]   [phone] or [phone]   [place], [place] [phone]   [facility] [address], [place], [place] [phone] Insurance/Medicaid/sponsorship  through [facility] [address]                                    [place], [place] [phone] [place] [address], [place] [phone]    Dr. Adele Schilder  [phone]   [facility] Dept. 1) [address] [place] 2) [place] 3)  [place] [address], [place] [phone]  [phone]   [place] [phone] or [phone] (After Hours)

## 2014-08-31 ENCOUNTER — Emergency Department (HOSPITAL_COMMUNITY)
Admission: EM | Admit: 2014-08-31 | Discharge: 2014-09-01 | Disposition: A | Discharge status: Home / Self Care | Payer: 59 | Attending: Emergency Medicine | Admitting: Emergency Medicine

## 2014-08-31 ENCOUNTER — Emergency Department (HOSPITAL_COMMUNITY): Payer: 59

## 2014-08-31 ENCOUNTER — Encounter (HOSPITAL_COMMUNITY): Payer: Self-pay | Admitting: *Deleted

## 2014-08-31 DIAGNOSIS — R0789 Other chest pain: Secondary | ICD-10-CM

## 2014-08-31 DIAGNOSIS — Z8719 Personal history of other diseases of the digestive system: Secondary | ICD-10-CM | POA: Diagnosis not present

## 2014-08-31 DIAGNOSIS — Z79899 Other long term (current) drug therapy: Secondary | ICD-10-CM | POA: Diagnosis not present

## 2014-08-31 DIAGNOSIS — R079 Chest pain, unspecified: Secondary | ICD-10-CM | POA: Diagnosis present

## 2014-08-31 DIAGNOSIS — I10 Essential (primary) hypertension: Secondary | ICD-10-CM | POA: Diagnosis not present

## 2014-08-31 LAB — CBC
HEMATOCRIT: 39.7 % (ref 36.0–46.0)
Hemoglobin: 12.3 g/dL (ref 12.0–15.0)
MCH: 25.8 pg — AB (ref 26.0–34.0)
MCHC: 31 g/dL (ref 30.0–36.0)
MCV: 83.2 fL (ref 78.0–100.0)
Platelets: 254 10*3/uL (ref 150–400)
RBC: 4.77 MIL/uL (ref 3.87–5.11)
RDW: 14.8 % (ref 11.5–15.5)
WBC: 4.6 10*3/uL (ref 4.0–10.5)

## 2014-08-31 LAB — I-STAT TROPONIN, ED: Troponin i, poc: 0 ng/mL (ref 0.00–0.08)

## 2014-08-31 MED ORDER — HYDROCHLOROTHIAZIDE 25 MG PO TABS
25.0000 mg | ORAL_TABLET | Freq: Every day | ORAL | Status: DC
  Administered 2014-09-01: 25 mg via ORAL
  Filled 2014-08-31: qty 1

## 2014-08-31 NOTE — ED Notes (Signed)
Pt states that she experienced CP Tuesday which subsided Wednesday and returned today. Radiation to left arm with no other symptoms.

## 2014-08-31 NOTE — ED Provider Notes (Signed)
CSN: [id number]     Arrival date & time 08/31/14  2206 History   First MD Initiated Contact with Patient 08/31/14 2335     Chief Complaint  Patient presents with  . Chest Pain     (Consider location/radiation/quality/duration/timing/severity/associated sxs/prior Treatment) HPI Comments: Normally health active thin female with Hx for Postpartum HTN who took Procardia for about 1 month and none since now with intermittent L chest pain that does not radiate. Worse with certain positions not associated with SOB, diaphoresis, nausea., headache, visual disturbance   Patient is a 39 y.o. female presenting with chest pain. The history is provided by the patient.  Chest Pain Pain location:  L chest Pain quality: stabbing   Pain radiates to:  Does not radiate Pain radiates to the back: no   Pain severity:  Mild Onset quality:  Gradual Duration:  3 days Timing:  Intermittent Progression:  Unchanged Chronicity:  New Context: not breathing, not eating, no intercourse, no movement, not at rest and no stress   Relieved by:  Rest Worsened by:  Certain positions Ineffective treatments:  None tried Associated symptoms: no anxiety, no back pain, no cough, no dizziness, no fever, no lower extremity edema, no palpitations and no shortness of breath   Risk factors: hypertension   Risk factors: no aortic disease, no high cholesterol, not female, not obese, not pregnant and no prior DVT/PE     Past Medical History  Diagnosis Date  . GERD (gastroesophageal reflux disease)     tums prn  . 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 Cesarean section      x2   No family history on file. History  Substance Use Topics  . Smoking status: Never Smoker   . Smokeless tobacco: Not on file  . Alcohol Use: No   OB History    Gravida Para Term Preterm AB TAB SAB Ectopic Multiple Living   3 3 2 1  0     3     Review of Systems   Constitutional: Negative for fever and chills.  Respiratory: Negative for cough, chest tightness and shortness of breath.   Cardiovascular: Positive for chest pain. Negative for palpitations and leg swelling.  Musculoskeletal: Negative for myalgias, back pain and neck pain.  Skin: Negative for rash and wound.  Neurological: Negative for dizziness.  All other systems reviewed and are negative.     Allergies  Codeine  Home Medications   Prior to Admission medications   Medication Sig Start Date End Date Taking? Authorizing Provider  acetaminophen (TYLENOL) 325 MG tablet Take 650 mg by mouth every 6 (six) hours as needed for mild pain.   Yes Historical Provider, MD  ibuprofen (ADVIL,MOTRIN) 200 MG tablet Take 200 mg by mouth every 6 (six) hours as needed for headache.   Yes Historical Provider, MD  hydrochlorothiazide (HYDRODIURIL) 25 MG tablet Take 1 tablet (25 mg total) by mouth daily. 09/01/14   Earley FavorGail Stein Windhorst, NP  NIFEdipine (PROCARDIA-XL/ADALAT CC) 30 MG 24 hr tablet Take 1 tablet (30 mg total) by mouth daily. Patient not taking: Reported on 03/04/2014 02/15/11 02/15/12  Sheronette Cousins, MD   BP 190/103 mmHg  Pulse 95  Temp(Src) 98.5 F (36.9 C) (Oral)  Resp 15  Ht 5\' 5"  (1.651 m)  Wt 124 lb (56.246 kg)  BMI 20.63 kg/m2  SpO2 100%  LMP 08/22/2014 Physical Exam  Constitutional: She is oriented to person, place, and time.  She appears well-developed.  HENT:  Head: Normocephalic.  Eyes: Pupils are equal, round, and reactive to light.  Neck: Normal range of motion.  Cardiovascular: Normal rate and regular rhythm.   Pulmonary/Chest: Effort normal and breath sounds normal.  Abdominal: Soft. She exhibits no distension. There is no tenderness.  Musculoskeletal: Normal range of motion. She exhibits no edema or tenderness.  Neurological: She is alert and oriented to person, place, and time.  Skin: Skin is warm. No rash noted.  Vitals reviewed.   ED Course  Procedures  (including critical care time) Labs Review Labs Reviewed  CBC - Abnormal; Notable for the following:    MCH 25.8 (*)    All other components within normal limits  BASIC METABOLIC PANEL  I-STAT TROPOININ, ED    Imaging Review Dg Chest 2 View  08/31/2014   CLINICAL DATA:  Acute onset of intermittent chest pain for 2 days. Initial encounter.  EXAM: CHEST  2 VIEW  COMPARISON:  Chest radiograph performed 03/04/2014  FINDINGS: The lungs are well-aerated and clear. There is no evidence of focal opacification, pleural effusion or pneumothorax. A calcified granuloma is noted at the left midlung zone.  The heart is normal in size; the mediastinal contour is within normal limits. No acute osseous abnormalities are seen.  IMPRESSION: No acute cardiopulmonary process seen.   Electronically Signed   By: Roanna Raider M.D.   On: 08/31/2014 23:02     EKG Interpretation   Date/Time:  Thursday August 31 2014 22:10:20 EDT Ventricular Rate:  95 PR Interval:  120 QRS Duration: 88 QT Interval:  350 QTC Calculation: 439 R Axis:   78 Text Interpretation:  Normal sinus rhythm Possible Left atrial enlargement  Nonspecific ST and T wave abnormality Abnormal ECG Confirmed by OTTER  MD,  OLGA ([id number]) on 08/31/2014 11:57:58 PM      MDM   Final diagnoses:  Essential hypertension  Chest pain of uncertain etiology         Earley Favor, NP 09/01/14 0003  Marisa Severin, MD 09/01/14 [phone]

## 2014-09-01 ENCOUNTER — Emergency Department (HOSPITAL_COMMUNITY): Admit: 2014-09-01 | Payer: 59 | Source: Home / Self Care

## 2014-09-01 LAB — BASIC METABOLIC PANEL
Anion gap: 7 (ref 5–15)
BUN: 10 mg/dL (ref 6–20)
CO2: 27 mmol/L (ref 22–32)
CREATININE: 0.87 mg/dL (ref 0.44–1.00)
Calcium: 9.2 mg/dL (ref 8.9–10.3)
Chloride: 103 mmol/L (ref 101–111)
GFR calc Af Amer: >60 mL/min (ref >60)
GFR calc non Af Amer: >60 mL/min (ref >60)
Glucose, Bld: 98 mg/dL (ref 65–99)
POTASSIUM: 3.7 mmol/L (ref 3.5–5.1)
Sodium: 137 mmol/L (ref 135–145)

## 2014-09-01 MED ORDER — HYDROCHLOROTHIAZIDE 25 MG PO TABS: 25.0000 mg | ORAL_TABLET | Freq: Every day | ORAL | Status: AC

## 2014-09-01 NOTE — Discharge Instructions (Signed)
Chest Pain (Nonspecific) It is often hard to give a diagnosis for the cause of chest pain. There is always a chance that your pain could be related to something serious, such as a heart attack or a blood clot in the lungs. You need to follow up with your doctor. HOME CARE  If antibiotic medicine was given, take it as directed by your doctor. Finish the medicine even if you start to feel better.  For the next few days, avoid activities that bring on chest pain. Continue physical activities as told by your doctor.  Do not use any tobacco products. This includes cigarettes, chewing tobacco, and e-cigarettes.  Avoid drinking alcohol.  Only take medicine as told by your doctor.  Follow your doctor's suggestions for more testing if your chest pain does not go away.  Keep all doctor visits you made. GET HELP IF:  Your chest pain does not go away, even after treatment.  You have a rash with blisters on your chest.  You have a fever. GET HELP RIGHT AWAY IF:   You have more pain or pain that spreads to your arm, neck, jaw, back, or belly (abdomen).  You have shortness of breath.  You cough more than usual or cough up blood.  You have very bad back or belly pain.  You feel sick to your stomach (nauseous) or throw up (vomit).  You have very bad weakness.  You pass out (faint).  You have chills. This is an emergency. Do not wait to see if the problems will go away. Call your local emergency services (911 in [place] Do not drive yourself to the hospital. MAKE SURE YOU:   Understand these instructions.  Will watch your condition.  Will get help right away if you are not doing well or get worse. Document Released: 09/03/2007 Document Revised: 03/22/2013 Document Reviewed: 09/03/2007 [facility] Patient Information 2015 [place], [place]. This information is not intended to replace advice given to you by your health care provider. Make sure you discuss any questions you have with your  health care provider. You have been given a prescription for blood pressure medication  Please take this on a regular basis  Make an appointment with the physician of choice for follow up care  Keep a diary of your blood pressures --take you blood pressure at your local pharmacy 2-3 times a week until seen by PCP

## 2014-09-01 NOTE — ED Notes (Signed)
Pt stable, ambulatory, states understanding of discharge instructions 

## 2014-11-28 ENCOUNTER — Other Ambulatory Visit: Payer: Self-pay | Admitting: Family Medicine

## 2014-11-28 DIAGNOSIS — R109 Unspecified abdominal pain: Secondary | ICD-10-CM

## 2014-12-11 ENCOUNTER — Ambulatory Visit
Admission: RE | Admit: 2014-12-11 | Discharge: 2014-12-11 | Disposition: A | Discharge status: Home / Self Care | Payer: 59 | Source: Ambulatory Visit | Attending: Family Medicine | Admitting: Family Medicine

## 2014-12-11 DIAGNOSIS — R109 Unspecified abdominal pain: Secondary | ICD-10-CM

## 2015-05-19 ENCOUNTER — Encounter (HOSPITAL_COMMUNITY): Payer: Self-pay | Admitting: Emergency Medicine

## 2015-05-19 ENCOUNTER — Emergency Department (INDEPENDENT_AMBULATORY_CARE_PROVIDER_SITE_OTHER)
Admission: EM | Admit: 2015-05-19 | Discharge: 2015-05-19 | Disposition: A | Discharge status: Home / Self Care | Payer: 59 | Source: Home / Self Care | Attending: Emergency Medicine | Admitting: Emergency Medicine

## 2015-05-19 DIAGNOSIS — K2100 Gastro-esophageal reflux disease with esophagitis, without bleeding: Secondary | ICD-10-CM

## 2015-05-19 DIAGNOSIS — R0789 Other chest pain: Secondary | ICD-10-CM | POA: Diagnosis not present

## 2015-05-19 NOTE — Discharge Instructions (Signed)
Chest Wall Pain Chest wall pain is pain in or around the bones and muscles of your chest. Sometimes, an injury causes this pain. Sometimes, the cause may not be known. This pain may take several weeks or longer to get better. HOME CARE INSTRUCTIONS  Pay attention to any changes in your symptoms. Take these actions to help with your pain:   Rest as told by your health care provider.   Avoid activities that cause pain. These include any activities that use your chest muscles or your abdominal and side muscles to lift heavy items.   If directed, apply ice to the painful area:  Put ice in a plastic bag.  Place a towel between your skin and the bag.  Leave the ice on for 20 minutes, 2-3 times per day.  Take over-the-counter and prescription medicines only as told by your health care provider.  Do not use tobacco products, including cigarettes, chewing tobacco, and e-cigarettes. If you need help quitting, ask your health care provider.  Keep all follow-up visits as told by your health care provider. This is important. SEEK MEDICAL CARE IF:  You have a fever.  Your chest pain becomes worse.  You have new symptoms. SEEK IMMEDIATE MEDICAL CARE IF:  You have nausea or vomiting.  You feel sweaty or light-headed.  You have a cough with phlegm (sputum) or you cough up blood.  You develop shortness of breath.   This information is not intended to replace advice given to you by your health care provider. Make sure you discuss any questions you have with your health care provider.   Document Released: 03/17/2005 Document Revised: 12/06/2014 Document Reviewed: 06/12/2014 Elsevier Interactive Patient Education 2016 [employer]  Esophagitis Zantac 150 mg twice a day for next week. Esophagitis is inflammation of the esophagus. The esophagus is the tube that carries food and liquids from your mouth to your stomach. Esophagitis can cause soreness or pain in the esophagus. This condition  can make it difficult and painful to swallow.  CAUSES Most causes of esophagitis are not serious. Common causes of this condition include:  Gastroesophageal reflux disease (GERD). This is when stomach contents move back up into the esophagus (reflux).  Repeated vomiting.  An allergic-type reaction, especially caused by food allergies (eosinophilic esophagitis).  Injury to the esophagus by swallowing large pills with or without water, or swallowing certain types of medicines.  Swallowing (ingesting) harmful chemicals, such as household cleaning products.  Heavy alcohol use.  An infection of the esophagus.This most often occurs in people who have a weakened immune system.  Radiation or chemotherapy treatment for cancer.  Certain diseases such as sarcoidosis, Crohn disease, and scleroderma. SYMPTOMS Symptoms of this condition include:  Difficult or painful swallowing.  Pain with swallowing acidic liquids, such as citrus juices.  Pain with burping.  Chest pain.  Difficulty breathing.  Nausea.  Vomiting.  Pain in the abdomen.  Weight loss.  Ulcers in the mouth.  Patches of white material in the mouth (candidiasis).  Fever.  Coughing up blood or vomiting blood.  Stool that is black, tarry, or bright red. DIAGNOSIS Your health care provider will take a medical history and perform a physical exam. You may also have other tests, including:  An endoscopy to examine your stomach and esophagus with a small camera.  A test that measures the acidity level in your esophagus.  A test that measures how much pressure is on your esophagus.  A barium swallow or modified barium swallow  to show the shape, size, and functioning of your esophagus.  Allergy tests. TREATMENT Treatment for this condition depends on the cause of your esophagitis. In some cases, steroids or other medicines may be given to help relieve your symptoms or to treat the underlying cause of your  condition. You may have to make some lifestyle changes, such as:  Avoiding alcohol.  Quitting smoking.  Changing your diet.  Exercising.  Changing your sleep habits and your sleep environment. HOME CARE INSTRUCTIONS Take these actions to decrease your discomfort and to help avoid complications. Diet  Follow a diet as recommended by your health care provider. This may involve avoiding foods and drinks such as:  Coffee and tea (with or without caffeine).  Drinks that contain alcohol.  Energy drinks and sports drinks.  Carbonated drinks or sodas.  Chocolate and cocoa.  Peppermint and mint flavorings.  Garlic and onions.  Horseradish.  Spicy and acidic foods, including peppers, chili powder, curry powder, vinegar, hot sauces, and barbecue sauce.  Citrus fruit juices and citrus fruits, such as oranges, lemons, and limes.  Tomato-based foods, such as red sauce, chili, salsa, and pizza with red sauce.  Fried and fatty foods, such as donuts, french fries, potato chips, and high-fat dressings.  High-fat meats, such as hot dogs and fatty cuts of red and white meats, such as rib eye steak, sausage, ham, and bacon.  High-fat dairy items, such as whole milk, butter, and cream cheese.  Eat small, frequent meals instead of large meals.  Avoid drinking large amounts of liquid with your meals.  Avoid eating meals during the 2-3 hours before bedtime.  Avoid lying down right after you eat.  Do not exercise right after you eat.  Avoid foods and drinks that seem to make your symptoms worse. General Instructions  Pay attention to any changes in your symptoms.  Take over-the-counter and prescription medicines only as told by your health care provider. Do not take aspirin, ibuprofen, or other NSAIDs unless your health care provider told you to do so.  If you have trouble taking pills, use a pill splitter to decrease the size of the pill. This will decrease the chance of the pill  getting stuck or injuring your esophagus on the way down. Also, drink water after you take a pill.  Do not use any tobacco products, including cigarettes, chewing tobacco, and e-cigarettes. If you need help quitting, ask your health care provider.  Wear loose-fitting clothing. Do not wear anything tight around your waist that causes pressure on your abdomen.  Raise (elevate) the head of your bed about 6 inches (15 cm).  Try to reduce your stress, such as with yoga or meditation. If you need help reducing stress, ask your health care provider.  If you are overweight, reduce your weight to an amount that is healthy for you. Ask your health care provider for guidance about a safe weight loss goal.  Keep all follow-up visits as told by your health care provider. This is important. SEEK MEDICAL CARE IF:  You have new symptoms.  You have unexplained weight loss.  You have difficulty swallowing, or it hurts to swallow.  You have wheezing or a persistent cough.  Your symptoms do not improve with treatment.  You have frequent heartburn for more than two weeks. SEEK IMMEDIATE MEDICAL CARE IF:  You have severe pain in your arms, neck, jaw, teeth, or back.  You feel sweaty, dizzy, or light-headed.  You have chest pain or shortness  of breath.  You vomit and your vomit looks like blood or coffee grounds.  Your stool is bloody or black.  You have a fever.  You cannot swallow, drink, or eat.   This information is not intended to replace advice given to you by your health care provider. Make sure you discuss any questions you have with your health care provider.   Document Released: 04/24/2004 Document Revised: 12/06/2014 Document Reviewed: 07/12/2014 Elsevier Interactive Patient Education 2016 Else[employer] Inc.  Gastroesophageal Reflux Disease, Adult Normally, food travels down the esophagus and stays in the stomach to be digested. However, when a person has gastroesophageal reflux  disease (GERD), food and stomach acid move back up into the esophagus. When this happens, the esophagus becomes sore and inflamed. Over time, GERD can create small holes (ulcers) in the lining of the esophagus.  CAUSES This condition is caused by a problem with the muscle between the esophagus and the stomach (lower esophageal sphincter, or LES). Normally, the LES muscle closes after food passes through the esophagus to the stomach. When the LES is weakened or abnormal, it does not close properly, and that allows food and stomach acid to go back up into the esophagus. The LES can be weakened by certain dietary substances, medicines, and medical conditions, including:  Tobacco use.  Pregnancy.  Having a hiatal hernia.  Heavy alcohol use.  Certain foods and beverages, such as coffee, chocolate, onions, and peppermint. RISK FACTORS This condition is more likely to develop in:  People who have an increased body weight.  People who have connective tissue disorders.  People who use NSAID medicines. SYMPTOMS Symptoms of this condition include:  Heartburn.  Difficult or painful swallowing.  The feeling of having a lump in the throat.  Abitter taste in the mouth.  Bad breath.  Having a large amount of saliva.  Having an upset or bloated stomach.  Belching.  Chest pain.  Shortness of breath or wheezing.  Ongoing (chronic) cough or a night-time cough.  Wearing away of tooth enamel.  Weight loss. Different conditions can cause chest pain. Make sure to see your health care provider if you experience chest pain. DIAGNOSIS Your health care provider will take a medical history and perform a physical exam. To determine if you have mild or severe GERD, your health care provider may also monitor how you respond to treatment. You may also have other tests, including:  An endoscopy toexamine your stomach and esophagus with a small camera.  A test thatmeasures the acidity level in  your esophagus.  A test thatmeasures how much pressure is on your esophagus.  A barium swallow or modified barium swallow to show the shape, size, and functioning of your esophagus. TREATMENT The goal of treatment is to help relieve your symptoms and to prevent complications. Treatment for this condition may vary depending on how severe your symptoms are. Your health care provider may recommend:  Changes to your diet.  Medicine.  Surgery. HOME CARE INSTRUCTIONS Diet  Follow a diet as recommended by your health care provider. This may involve avoiding foods and drinks such as:  Coffee and tea (with or without caffeine).  Drinks that containalcohol.  Energy drinks and sports drinks.  Carbonated drinks or sodas.  Chocolate and cocoa.  Peppermint and mint flavorings.  Garlic and onions.  Horseradish.  Spicy and acidic foods, including peppers, chili powder, curry powder, vinegar, hot sauces, and barbecue sauce.  Citrus fruit juices and citrus fruits, such as oranges, lemons, and  limes.  Tomato-based foods, such as red sauce, chili, salsa, and pizza with red sauce.  Fried and fatty foods, such as donuts, french fries, potato chips, and high-fat dressings.  High-fat meats, such as hot dogs and fatty cuts of red and white meats, such as rib eye steak, sausage, ham, and bacon.  High-fat dairy items, such as whole milk, butter, and cream cheese.  Eat small, frequent meals instead of large meals.  Avoid drinking large amounts of liquid with your meals.  Avoid eating meals during the 2-3 hours before bedtime.  Avoid lying down right after you eat.  Do not exercise right after you eat. General Instructions  Pay attention to any changes in your symptoms.  Take over-the-counter and prescription medicines only as told by your health care provider. Do not take aspirin, ibuprofen, or other NSAIDs unless your health care provider told you to do so.  Do not use any  tobacco products, including cigarettes, chewing tobacco, and e-cigarettes. If you need help quitting, ask your health care provider.  Wear loose-fitting clothing. Do not wear anything tight around your waist that causes pressure on your abdomen.  Raise (elevate) the head of your bed 6 inches (15cm).  Try to reduce your stress, such as with yoga or meditation. If you need help reducing stress, ask your health care provider.  If you are overweight, reduce your weight to an amount that is healthy for you. Ask your health care provider for guidance about a safe weight loss goal.  Keep all follow-up visits as told by your health care provider. This is important. SEEK MEDICAL CARE IF:  You have new symptoms.  You have unexplained weight loss.  You have difficulty swallowing, or it hurts to swallow.  You have wheezing or a persistent cough.  Your symptoms do not improve with treatment.  You have a hoarse voice. SEEK IMMEDIATE MEDICAL CARE IF:  You have pain in your arms, neck, jaw, teeth, or back.  You feel sweaty, dizzy, or light-headed.  You have chest pain or shortness of breath.  You vomit and your vomit looks like blood or coffee grounds.  You faint.  Your stool is bloody or black.  You cannot swallow, drink, or eat.   This information is not intended to replace advice given to you by your health care provider. Make sure you discuss any questions you have with your health care provider.   Document Released: 12/25/2004 Document Revised: 12/06/2014 Document Reviewed: 07/12/2014 Elsevier Interactive Patient Education 2016 [employer].  Food Choices for Gastroesophageal Reflux Disease, Adult When you have gastroesophageal reflux disease (GERD), the foods you eat and your eating habits are very important. Choosing the right foods can help ease your discomfort.  WHAT GUIDELINES DO I NEED TO FOLLOW?   Choose fruits, vegetables, whole grains, and low-fat dairy products.    Choose low-fat meat, fish, and poultry.  Limit fats such as oils, salad dressings, butter, nuts, and avocado.   Keep a food diary. This helps you identify foods that cause symptoms.   Avoid foods that cause symptoms. These may be different for everyone.   Eat small meals often instead of 3 large meals a day.   Eat your meals slowly, in a place where you are relaxed.   Limit fried foods.   Cook foods using methods other than frying.   Avoid drinking alcohol.   Avoid drinking large amounts of liquids with your meals.   Avoid bending over or lying down until 2-3  hours after eating.  WHAT FOODS ARE NOT RECOMMENDED?  These are some foods and drinks that may make your symptoms worse: Vegetables Tomatoes. Tomato juice. Tomato and spaghetti sauce. Chili peppers. Onion and garlic. Horseradish. Fruits Oranges, grapefruit, and lemon (fruit and juice). Meats High-fat meats, fish, and poultry. This includes hot dogs, ribs, ham, sausage, salami, and bacon. Dairy Whole milk and chocolate milk. Sour cream. Cream. Butter. Ice cream. Cream cheese.  Drinks Coffee and tea. Bubbly (carbonated) drinks or energy drinks. Condiments Hot sauce. Barbecue sauce.  Sweets/Desserts Chocolate and cocoa. Donuts. Peppermint and spearmint. Fats and Oils High-fat foods. This includes [place] fries and potato chips. Other Vinegar. Strong spices. This includes black pepper, white pepper, red pepper, cayenne, curry powder, cloves, ginger, and chili powder. The items listed above may not be a complete list of foods and drinks to avoid. Contact your dietitian for more information.   This information is not intended to replace advice given to you by your health care provider. Make sure you discuss any questions you have with your health care provider.   Document Released: 09/16/2011 Document Revised: 04/07/2014 Document Reviewed: 01/19/2013 Elsevier Interactive Patient Education [employer].

## 2015-05-19 NOTE — ED Notes (Signed)
C/o intermittent CP onset yest associated w/left side arm "tingly" Also, has noticed "burping" more frequent Denies HA, SOB, dyspnea, n/v, diaphoresis A&O x4... No acute distress.

## 2015-05-19 NOTE — ED Provider Notes (Signed)
CSN: [id number]     Arrival date & time 05/19/15  1556 History   First MD Initiated Contact with Patient 05/19/15 1621     Chief Complaint  Patient presents with  . Chest Pain   (Consider location/radiation/quality/duration/timing/severity/associated sxs/prior Treatment) HPI Comments: 40 year old female who considers herself to be in good condition and who exercises twice weekly without symptoms is complaining of pain to the left upper anterior chest and shoulder since yesterday. It is intermittent. He comes and goes, waxes and wanes. She occasionally has numbness in the left arm associated with the pain. There is a small area of the left pectoralis muscle or a crosses to the humerus as a source of tenderness. The pain does not radiate. It is not associated with shortness of breath, diaphoresis, nausea, heaviness, tightness, pressure or squeezing in the chest.  The associated condition is most consistent is belching. Often she has belching just prior to chest pain and sometimes after belching this will temporarily relieve the left chest pain. Sometimes the pain will return later when she is not belching.  At this time she is having no discomfort. She is fully awake and alert, jovial, laughing, energetic speech showing some no signs of distress.   Past Medical History  Diagnosis Date  . GERD (gastroesophageal reflux disease)     tums prn  . 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 Cesarean section      x2   No family history on file. Social History  Substance Use Topics  . Smoking status: Never Smoker   . Smokeless tobacco: None  . Alcohol Use: No   OB History    Gravida Para Term Preterm AB TAB SAB Ectopic Multiple Living   0     3     Review of Systems  Constitutional: Negative for fever, chills, diaphoresis, activity change, appetite change, fatigue and unexpected weight change.  HENT: Negative.    Eyes: Negative.   Respiratory: Negative for cough, choking, chest tightness, shortness of breath and wheezing.   Cardiovascular: Positive for chest pain. Negative for palpitations and leg swelling.  Gastrointestinal: Negative for nausea, vomiting, constipation and blood in stool.       Belching, upper GI gas.  Genitourinary: Negative.   Skin: Negative.   Neurological: Negative.   Psychiatric/Behavioral: Negative.   All other systems reviewed and are negative.   Allergies  Codeine  Home Medications   Prior to Admission medications   Medication Sig Start Date End Date Taking? Authorizing Provider  hydrochlorothiazide (HYDRODIURIL) 25 MG tablet Take 1 tablet (25 mg total) by mouth daily. 09/01/14  Yes Earley Favor, NP  acetaminophen (TYLENOL) 325 MG tablet Take 650 mg by mouth every 6 (six) hours as needed for mild pain.    Historical Provider, MD  ibuprofen (ADVIL,MOTRIN) 200 MG tablet Take 200 mg by mouth every 6 (six) hours as needed for headache.    Historical Provider, MD  NIFEdipine (PROCARDIA-XL/ADALAT CC) 30 MG 24 hr tablet Take 1 tablet (30 mg total) by mouth daily. Patient not taking: Reported on 03/04/2014 02/15/11 02/15/12  Maxie Better, MD   Meds Ordered and Administered this Visit  Medications - No data to display  BP 154/80 mmHg  Pulse 118  Temp(Src) 99.4 F (37.4 C) (Oral)  Resp 18  SpO2 100%  LMP 04/15/2015 No data found.   Physical Exam  Constitutional: She is oriented to person, place,  and time. She appears well-developed and well-nourished. No distress.  HENT:  Head: Normocephalic and atraumatic.  Mouth/Throat: Oropharynx is clear and moist.  Eyes: Conjunctivae and EOM are normal.  Neck: Normal range of motion. Neck supple.  Cardiovascular: Normal rate, regular rhythm, normal heart sounds and intact distal pulses.  Exam reveals no gallop and no friction rub.   No murmur heard. Pulmonary/Chest: Effort normal and breath sounds normal. No respiratory  distress. She has no wheezes. She has no rales.  There is one area of tenderness at the pectoralis major muscle as it attaches to the upper left humerus. No other areas of chest pain or tenderness. Having the patient performed butterfly maneuvers and other movements that utilize the pectoralis muscles do not reproduce the pain.  Abdominal: Soft. There is no tenderness.  Musculoskeletal: Normal range of motion. She exhibits tenderness. She exhibits no edema.  Lymphadenopathy:    She has no cervical adenopathy.  Neurological: She is alert and oriented to person, place, and time. She exhibits normal muscle tone.  Skin: Skin is warm and dry. No erythema.  Psychiatric: She has a normal mood and affect.  Nursing note and vitals reviewed.   ED Course  Procedures (including critical care time) ED ECG REPORT   Date: 05/19/2015  Rate: 87  Rhythm: normal sinus rhythm  QRS Axis: normal  Intervals: normal  ST/T Wave abnormalities: nonspecific T wave changes V2, early repol  Conduction Disutrbances:none  Narrative Interpretation:   Old EKG Reviewed: none available  I have personally reviewed the EKG tracing and agree with the computerized printout as noted.  Labs Review Labs Reviewed - No data to display  Imaging Review No results found.   Visual Acuity Review  Right Eye Distance:   Left Eye Distance:   Bilateral Distance:    Right Eye Near:   Left Eye Near:    Bilateral Near:         MDM   1. Chest wall pain   2. Gastroesophageal reflux disease with esophagitis     40 year old female considering herself to be in good health and who exercises at least twice a week by running and other types of work out has no symptoms during these times. She has no personal cardiac or pulmonary history. Never has smoked and does not use alcohol. She does have a history of GERD. It is likely that her left chest pain is associated with esophageal spasms and/or gas versus chest wall muscle  skeletal pain. EKG is normal. Considered low risk for cardiopulmonary etiology. Have recommended taking Zantac twice a day and applying ice to the area of the left anterior shoulder and chest off and on for the next few days. If symptoms are not improving may need to follow-up with PCP. For additional symptoms or worsening may need to go to the emergency department.    Hayden Rasmussen, NP 05/19/15 [phone]

## 2017-02-13 IMAGING — DX DG CHEST 2V
1 series · 1 of 1 positions shown · non-contrast
Comparison: Chest radiograph performed 03/04/2014

CLINICAL DATA: Acute onset of intermittent chest pain for 2 days.
Initial encounter.

EXAM:
CHEST  2 VIEW

[chest pa]
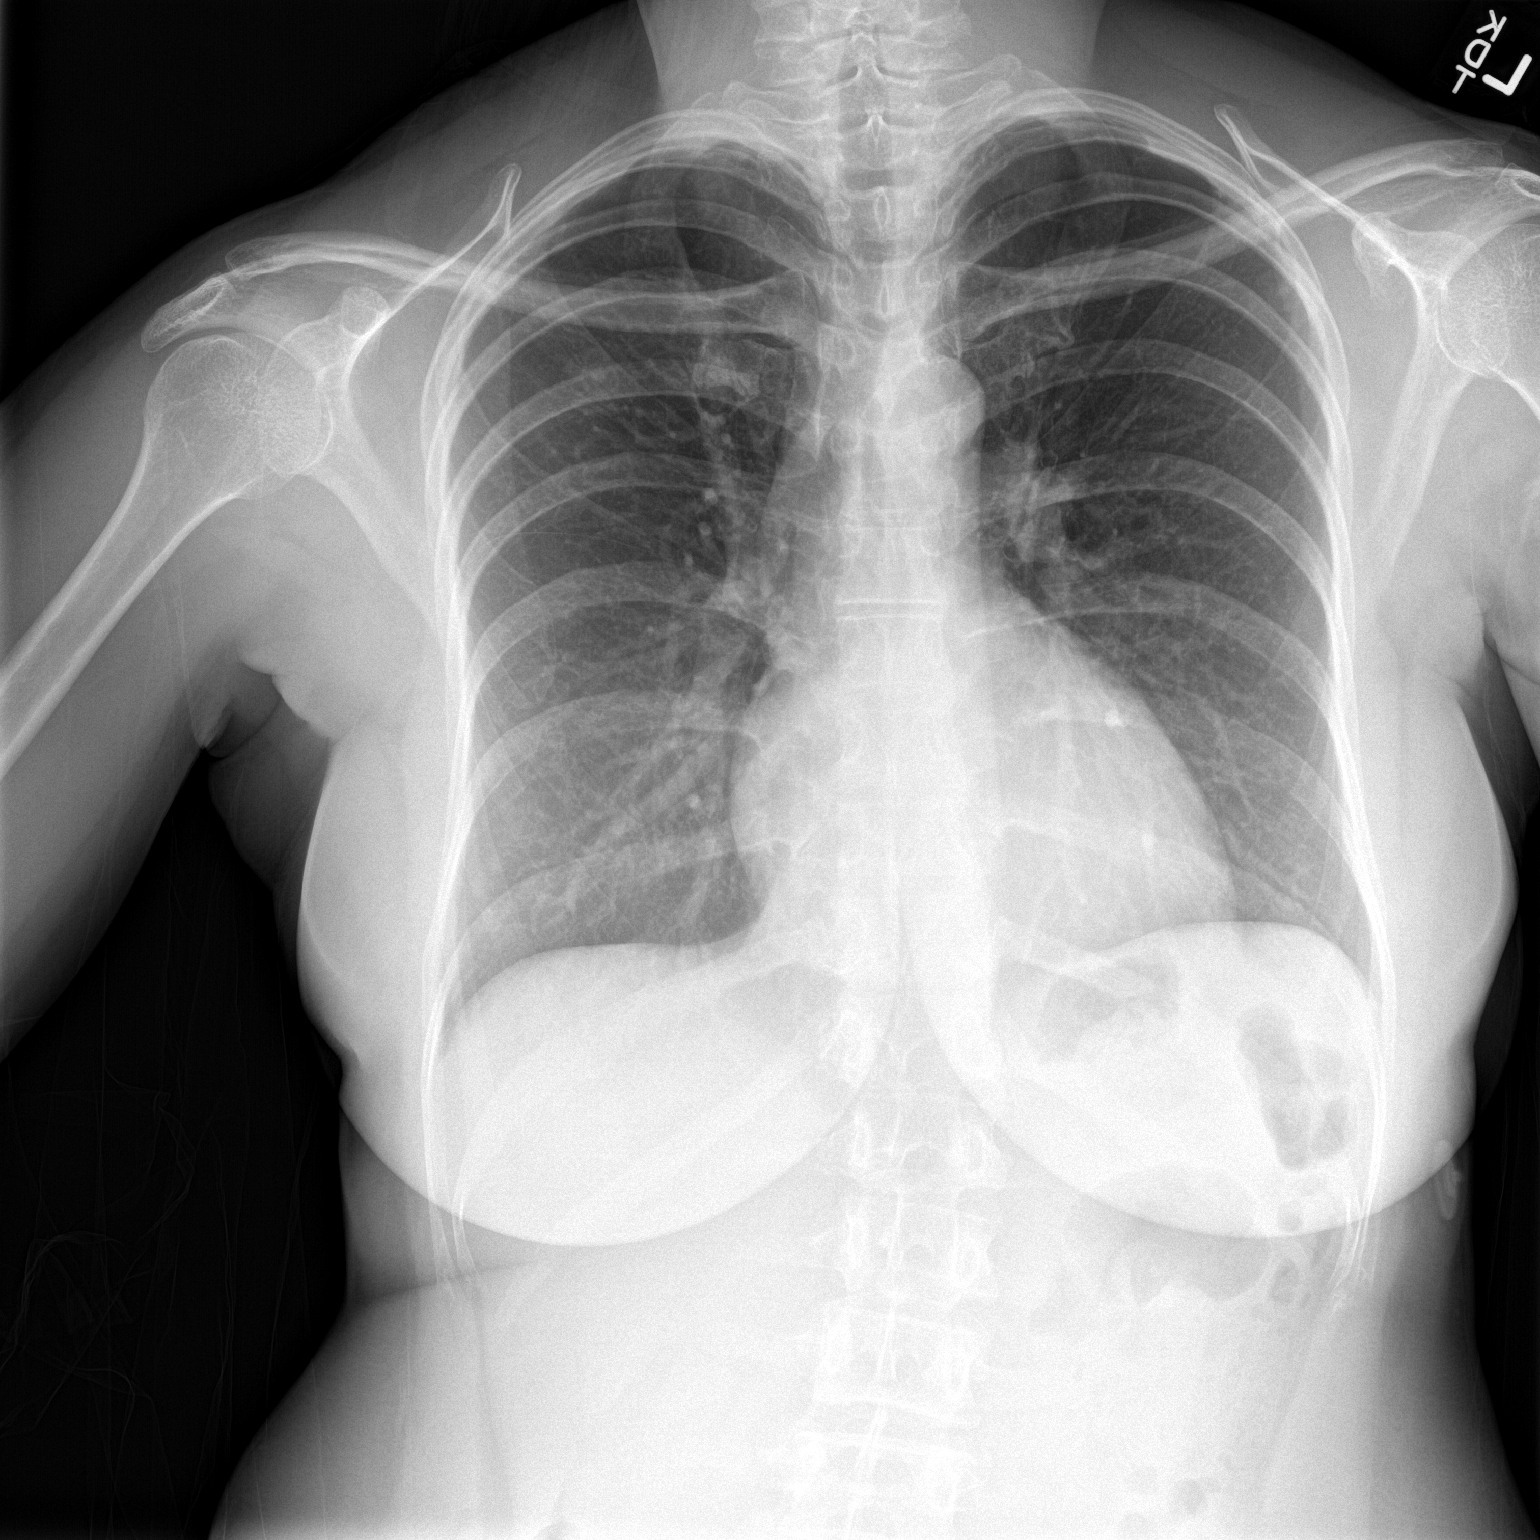

[1 of 1 positions shown; findings below may reference images not displayed]

FINDINGS: The lungs are well-aerated and clear. There is no evidence of focal
opacification, pleural effusion or pneumothorax. A calcified
granuloma is noted at the left midlung zone.

The heart is normal in size; the mediastinal contour is within
normal limits. No acute osseous abnormalities are seen.
IMPRESSION: No acute cardiopulmonary process seen.

## 2020-04-10 ENCOUNTER — Other Ambulatory Visit: Payer: Self-pay | Admitting: Family Medicine

## 2020-04-10 DIAGNOSIS — Z1231 Encounter for screening mammogram for malignant neoplasm of breast: Secondary | ICD-10-CM

## 2020-05-23 ENCOUNTER — Other Ambulatory Visit: Payer: Self-pay

## 2020-05-23 ENCOUNTER — Ambulatory Visit
Admission: RE | Admit: 2020-05-23 | Discharge: 2020-05-23 | Disposition: A | Discharge status: Home / Self Care | Payer: Medicaid Other | Source: Ambulatory Visit | Attending: Family Medicine | Admitting: Family Medicine

## 2020-05-23 DIAGNOSIS — Z1231 Encounter for screening mammogram for malignant neoplasm of breast: Secondary | ICD-10-CM

## 2021-04-09 ENCOUNTER — Other Ambulatory Visit: Payer: Self-pay | Admitting: Family Medicine

## 2021-04-09 DIAGNOSIS — Z1231 Encounter for screening mammogram for malignant neoplasm of breast: Secondary | ICD-10-CM

## 2021-05-24 ENCOUNTER — Ambulatory Visit
Admission: RE | Admit: 2021-05-24 | Discharge: 2021-05-24 | Disposition: A | Discharge status: Home / Self Care | Payer: Medicaid Other | Source: Ambulatory Visit | Attending: Family Medicine | Admitting: Family Medicine

## 2021-05-24 DIAGNOSIS — Z1231 Encounter for screening mammogram for malignant neoplasm of breast: Secondary | ICD-10-CM

## 2022-04-10 ENCOUNTER — Other Ambulatory Visit: Payer: Self-pay | Admitting: Family Medicine

## 2022-04-10 DIAGNOSIS — Z1231 Encounter for screening mammogram for malignant neoplasm of breast: Secondary | ICD-10-CM

## 2022-06-04 ENCOUNTER — Ambulatory Visit
Admission: RE | Admit: 2022-06-04 | Discharge: 2022-06-04 | Disposition: A | Discharge status: Home / Self Care | Payer: Medicaid Other | Source: Ambulatory Visit | Attending: Family Medicine | Admitting: Family Medicine

## 2022-06-04 DIAGNOSIS — Z1231 Encounter for screening mammogram for malignant neoplasm of breast: Secondary | ICD-10-CM

## 2023-11-07 IMAGING — MG MM DIGITAL SCREENING BILAT W/ TOMO AND CAD
6 of 10 series · 6 of 30 positions shown · non-contrast
Comparison: Previous exam(s).

CLINICAL DATA: Screening.

EXAM:
DIGITAL SCREENING BILATERAL MAMMOGRAM WITH TOMOSYNTHESIS AND CAD
TECHNIQUE: Bilateral screening digital craniocaudal and mediolateral oblique
mammograms were obtained. Bilateral screening digital breast
tomosynthesis was performed. The images were evaluated with
computer-aided detection.

[R MLO synth-2D (1 of 2)]
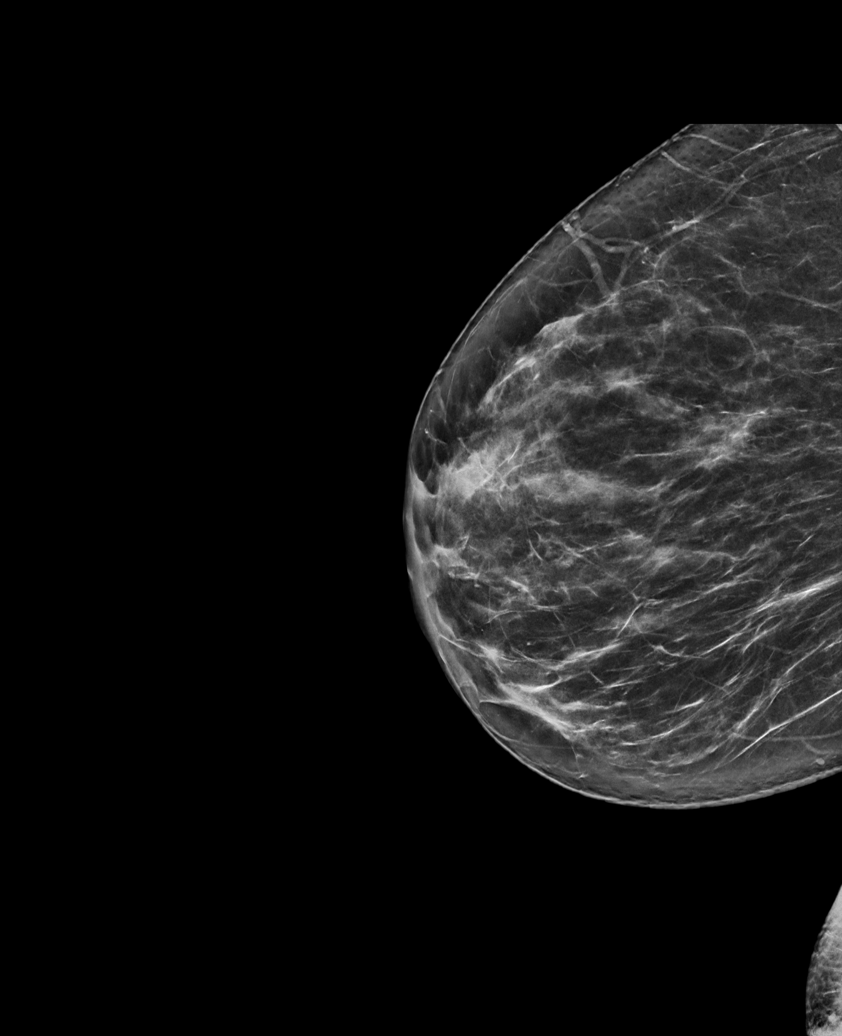

[L MLO synth-2D]
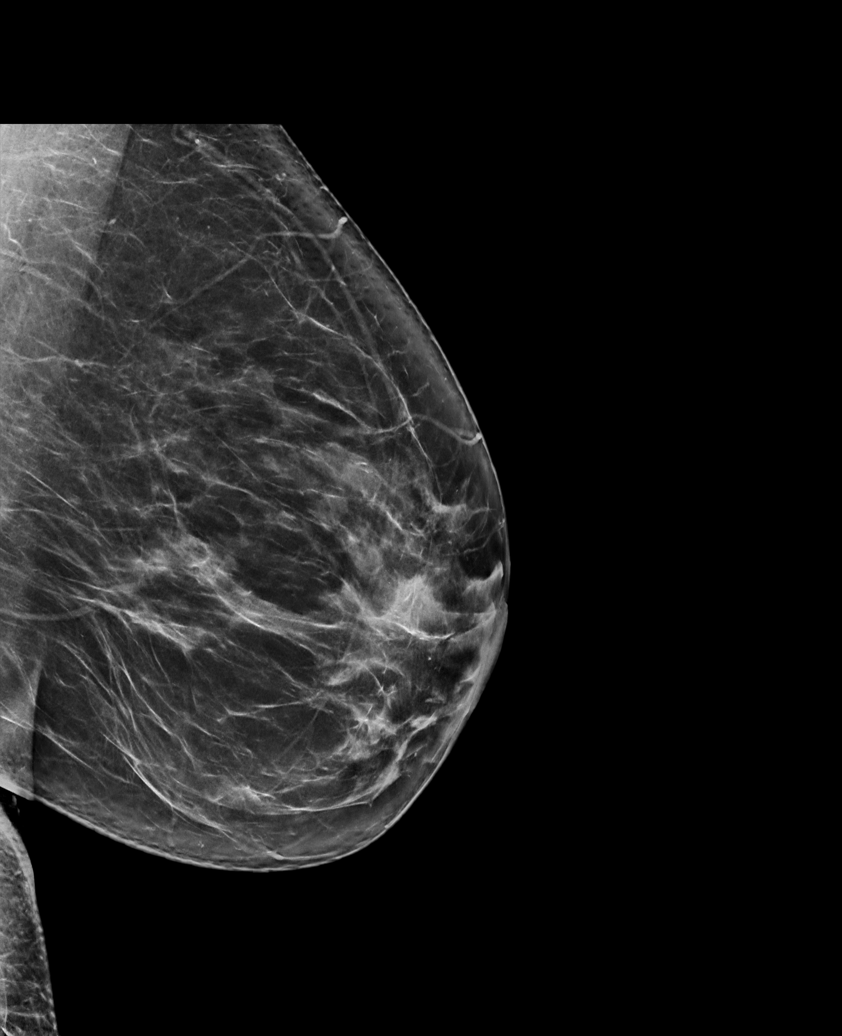

[R MLO synth-2D (2 of 2)]
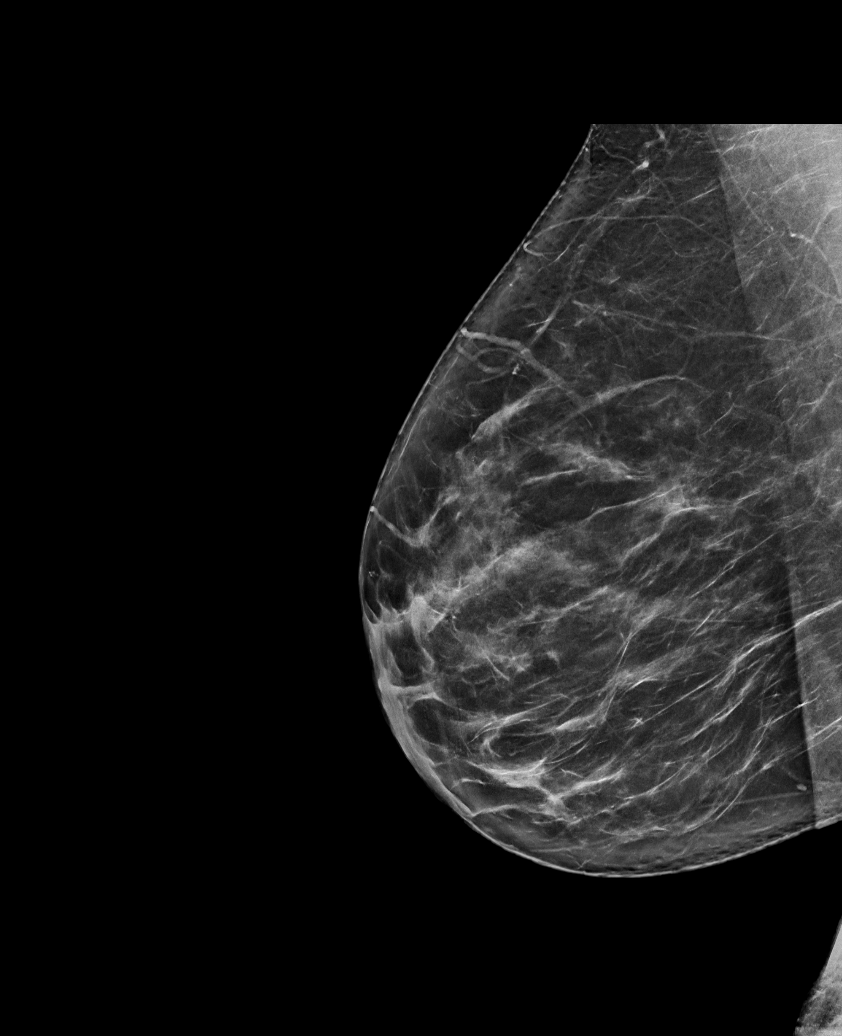

[R CC synth-2D]
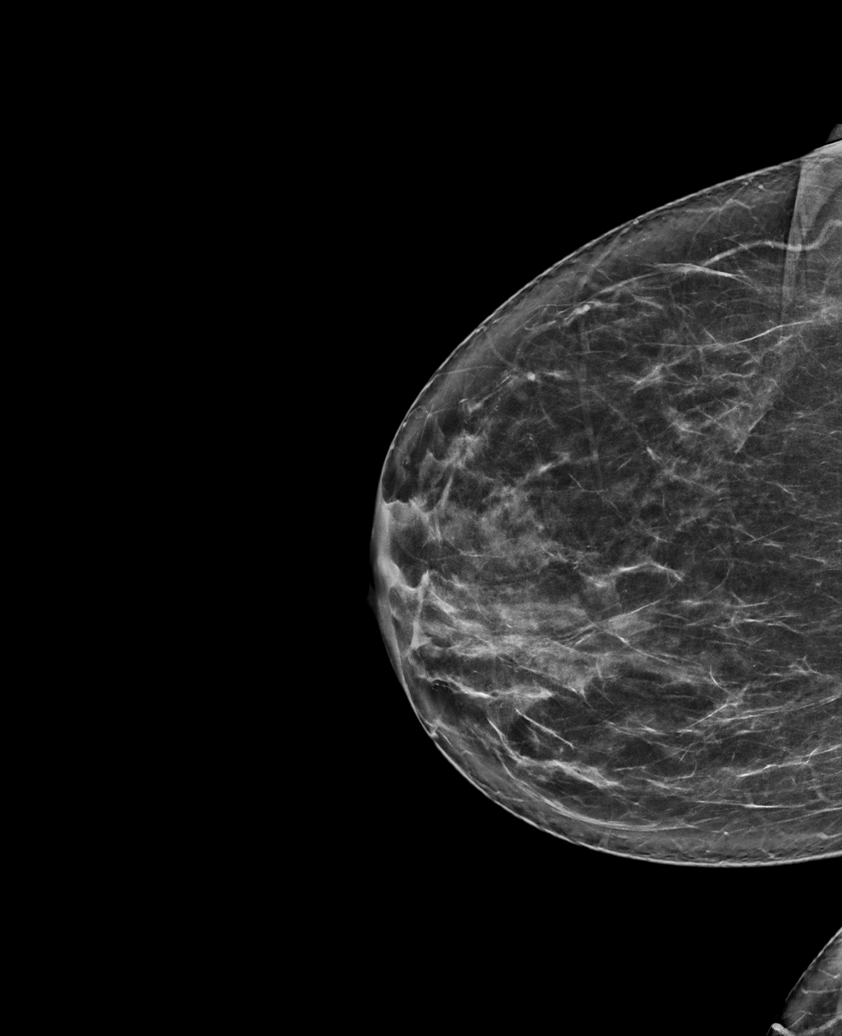

[L CC synth-2D]
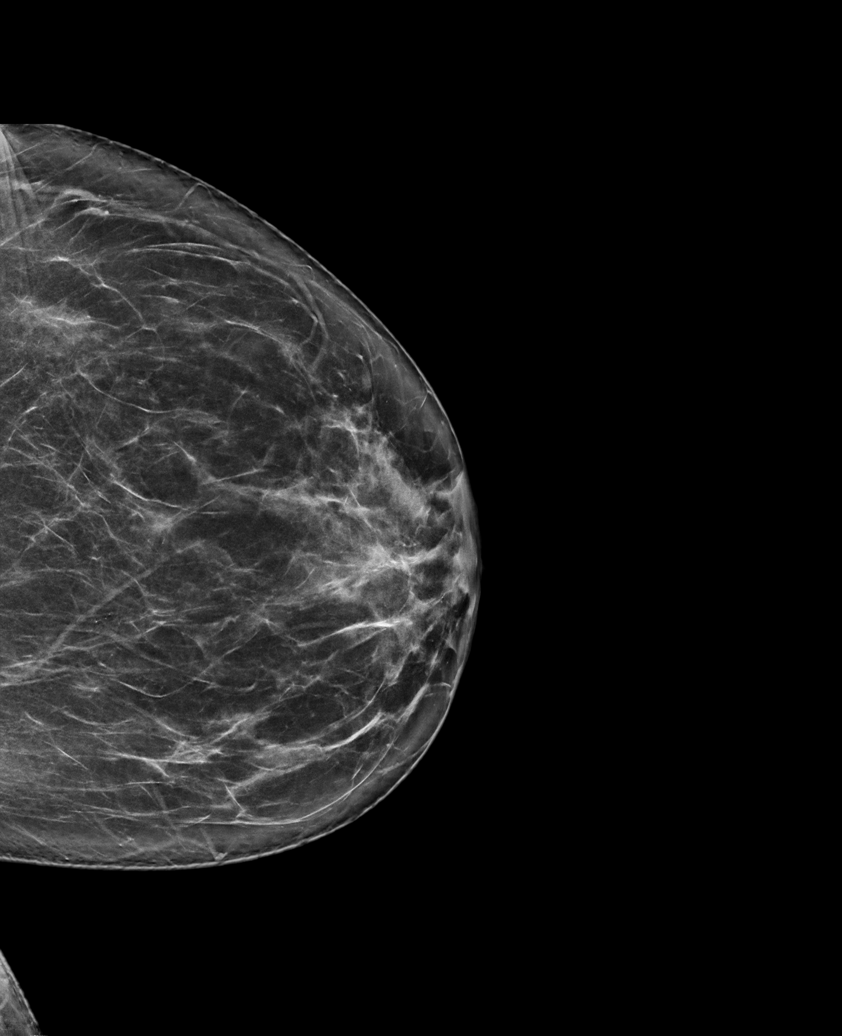

[L CC tomo · tomo slice 41/81.0]
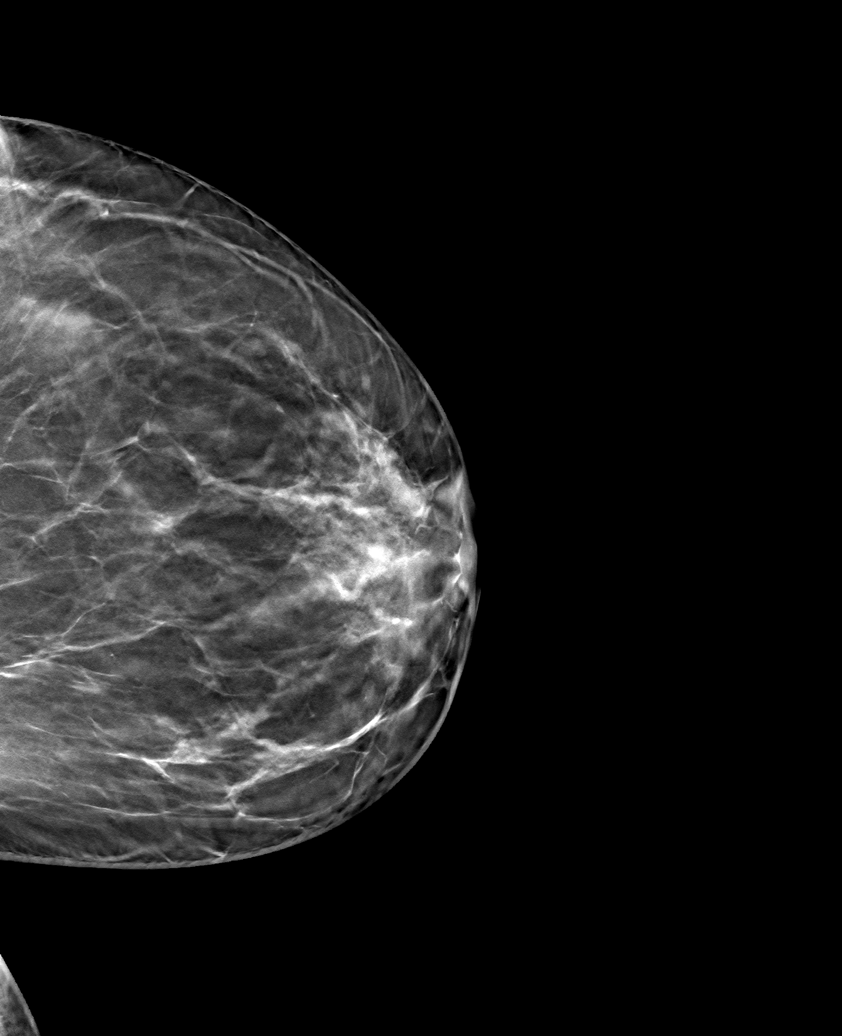

[6 of 30 positions shown; findings below may reference images not displayed]

ACR Breast Density Category b: There are scattered areas of
fibroglandular density.
FINDINGS: There are no findings suspicious for malignancy.
IMPRESSION: No mammographic evidence of malignancy. A result letter of this
screening mammogram will be mailed directly to the patient.

RECOMMENDATION:
Screening mammogram in one year. (Code:51-O-LD2)

BI-RADS CATEGORY  1: Negative.
